# Patient Record
Sex: Male | Born: 1955 | Race: Black or African American | Hispanic: No | Marital: Married | Smoking: Never smoker
Health system: Southern US, Community
[De-identification: ages and names within clinical notes are randomized; demographics above are authoritative.]

## PROBLEM LIST (undated history)

## (undated) DIAGNOSIS — G4733 Obstructive sleep apnea (adult) (pediatric): Secondary | ICD-10-CM

## (undated) DIAGNOSIS — E119 Type 2 diabetes mellitus without complications: Secondary | ICD-10-CM

## (undated) DIAGNOSIS — Z973 Presence of spectacles and contact lenses: Secondary | ICD-10-CM

## (undated) DIAGNOSIS — E7800 Pure hypercholesterolemia, unspecified: Secondary | ICD-10-CM

## (undated) DIAGNOSIS — G4730 Sleep apnea, unspecified: Secondary | ICD-10-CM

## (undated) DIAGNOSIS — E785 Hyperlipidemia, unspecified: Secondary | ICD-10-CM

## (undated) DIAGNOSIS — I10 Essential (primary) hypertension: Secondary | ICD-10-CM

## (undated) DIAGNOSIS — M1990 Unspecified osteoarthritis, unspecified site: Secondary | ICD-10-CM

## (undated) DIAGNOSIS — N529 Male erectile dysfunction, unspecified: Secondary | ICD-10-CM

## (undated) DIAGNOSIS — H409 Unspecified glaucoma: Secondary | ICD-10-CM

## (undated) HISTORY — DX: Essential (primary) hypertension: I10

## (undated) HISTORY — DX: Pure hypercholesterolemia, unspecified: E78.00

## (undated) HISTORY — DX: Male erectile dysfunction, unspecified: N52.9

## (undated) HISTORY — DX: Unspecified glaucoma: H40.9

## (undated) HISTORY — DX: Type 2 diabetes mellitus without complications: E11.9

## (undated) HISTORY — PX: INGUINAL HERNIA REPAIR: SHX194

## (undated) HISTORY — DX: Sleep apnea, unspecified: G47.30

---

## 2000-01-06 DIAGNOSIS — H409 Unspecified glaucoma: Secondary | ICD-10-CM

## 2000-01-06 HISTORY — DX: Unspecified glaucoma: H40.9

## 2000-12-27 ENCOUNTER — Emergency Department (HOSPITAL_COMMUNITY): Admission: EM | Admit: 2000-12-27 | Discharge: 2000-12-27 | Payer: Self-pay | Admitting: Emergency Medicine

## 2003-02-16 ENCOUNTER — Encounter: Admission: RE | Admit: 2003-02-16 | Discharge: 2003-02-16 | Payer: Self-pay | Admitting: *Deleted

## 2003-02-22 ENCOUNTER — Ambulatory Visit (HOSPITAL_COMMUNITY): Admission: RE | Admit: 2003-02-22 | Discharge: 2003-02-22 | Payer: Self-pay | Admitting: *Deleted

## 2005-03-11 ENCOUNTER — Encounter: Admission: RE | Admit: 2005-03-11 | Discharge: 2005-03-11 | Payer: Self-pay | Admitting: Family Medicine

## 2013-06-28 DIAGNOSIS — N529 Male erectile dysfunction, unspecified: Secondary | ICD-10-CM | POA: Insufficient documentation

## 2013-06-28 DIAGNOSIS — E7800 Pure hypercholesterolemia, unspecified: Secondary | ICD-10-CM | POA: Insufficient documentation

## 2013-06-29 ENCOUNTER — Encounter: Payer: BC Managed Care – PPO | Attending: Family Medicine

## 2013-07-06 ENCOUNTER — Ambulatory Visit: Payer: Self-pay

## 2013-07-13 ENCOUNTER — Ambulatory Visit: Payer: Self-pay

## 2013-07-20 ENCOUNTER — Encounter: Payer: BC Managed Care – PPO | Attending: Family Medicine

## 2013-07-20 VITALS — Ht 73.0 in | Wt 269.0 lb

## 2013-07-20 DIAGNOSIS — E119 Type 2 diabetes mellitus without complications: Secondary | ICD-10-CM | POA: Diagnosis not present

## 2013-07-20 NOTE — Progress Notes (Signed)
Patient was seen on 07/20/2013 for the first of a series of three diabetes self-management courses at the [facility].  Current HbA1c: 8.5%  The following learning objectives were met by the patient during this class:  Describe diabetes  State some common risk factors for diabetes  Defines the role of glucose and insulin  Identifies type of diabetes and pathophysiology  Describe the relationship between diabetes and cardiovascular risk  State the members of the Healthcare Team  States the rationale for glucose monitoring  State when to test glucose  State their individual Target Range  State the importance of logging glucose readings  Describe how to interpret glucose readings  Identifies A1C target  Explain the correlation between A1c and eAG values  State symptoms and treatment of high blood glucose  State symptoms and treatment of low blood glucose  Explain proper technique for glucose testing  Identifies proper sharps disposal  Handouts given during class include:  Living Well with Diabetes book  Carb Counting and Meal Planning book  Meal Plan Card  Carbohydrate guide  Meal planning worksheet  Low Sodium Flavoring Tips  The diabetes portion plate  Y1O to eAG Conversion Chart  Diabetes Medications  Diabetes Recommended Care Schedule  Support Group  Diabetes Success Plan  Core Class Satisfaction Survey  Follow-Up Plan:  Attend core 2

## 2013-07-27 ENCOUNTER — Ambulatory Visit: Payer: Self-pay

## 2013-08-03 ENCOUNTER — Ambulatory Visit: Payer: Self-pay

## 2015-07-18 DIAGNOSIS — H5201 Hypermetropia, right eye: Secondary | ICD-10-CM | POA: Diagnosis not present

## 2015-07-18 DIAGNOSIS — H5212 Myopia, left eye: Secondary | ICD-10-CM | POA: Diagnosis not present

## 2015-07-18 DIAGNOSIS — H401131 Primary open-angle glaucoma, bilateral, mild stage: Secondary | ICD-10-CM | POA: Diagnosis not present

## 2015-09-04 DIAGNOSIS — E7800 Pure hypercholesterolemia, unspecified: Secondary | ICD-10-CM | POA: Diagnosis not present

## 2015-09-04 DIAGNOSIS — I10 Essential (primary) hypertension: Secondary | ICD-10-CM | POA: Diagnosis not present

## 2015-09-04 DIAGNOSIS — E119 Type 2 diabetes mellitus without complications: Secondary | ICD-10-CM | POA: Diagnosis not present

## 2015-09-04 DIAGNOSIS — N529 Male erectile dysfunction, unspecified: Secondary | ICD-10-CM | POA: Diagnosis not present

## 2015-09-04 DIAGNOSIS — L84 Corns and callosities: Secondary | ICD-10-CM | POA: Diagnosis not present

## 2016-02-06 DIAGNOSIS — H401131 Primary open-angle glaucoma, bilateral, mild stage: Secondary | ICD-10-CM | POA: Diagnosis not present

## 2016-02-13 DIAGNOSIS — H40033 Anatomical narrow angle, bilateral: Secondary | ICD-10-CM | POA: Diagnosis not present

## 2016-08-18 DIAGNOSIS — H401131 Primary open-angle glaucoma, bilateral, mild stage: Secondary | ICD-10-CM | POA: Diagnosis not present

## 2016-08-18 DIAGNOSIS — H40032 Anatomical narrow angle, left eye: Secondary | ICD-10-CM | POA: Diagnosis not present

## 2016-09-14 DIAGNOSIS — E7800 Pure hypercholesterolemia, unspecified: Secondary | ICD-10-CM | POA: Diagnosis not present

## 2016-09-14 DIAGNOSIS — M2042 Other hammer toe(s) (acquired), left foot: Secondary | ICD-10-CM | POA: Diagnosis not present

## 2016-09-14 DIAGNOSIS — I10 Essential (primary) hypertension: Secondary | ICD-10-CM | POA: Diagnosis not present

## 2016-09-14 DIAGNOSIS — E119 Type 2 diabetes mellitus without complications: Secondary | ICD-10-CM | POA: Diagnosis not present

## 2016-10-21 DIAGNOSIS — H40033 Anatomical narrow angle, bilateral: Secondary | ICD-10-CM | POA: Diagnosis not present

## 2016-10-21 DIAGNOSIS — H40032 Anatomical narrow angle, left eye: Secondary | ICD-10-CM | POA: Diagnosis not present

## 2016-12-21 DIAGNOSIS — H401131 Primary open-angle glaucoma, bilateral, mild stage: Secondary | ICD-10-CM | POA: Diagnosis not present

## 2017-01-05 HISTORY — PX: COLONOSCOPY WITH PROPOFOL: SHX5780

## 2017-01-05 HISTORY — PX: COLONOSCOPY: SHX174

## 2017-01-22 DIAGNOSIS — G4733 Obstructive sleep apnea (adult) (pediatric): Secondary | ICD-10-CM | POA: Diagnosis not present

## 2017-03-30 DIAGNOSIS — H40033 Anatomical narrow angle, bilateral: Secondary | ICD-10-CM | POA: Diagnosis not present

## 2017-04-21 DIAGNOSIS — H40033 Anatomical narrow angle, bilateral: Secondary | ICD-10-CM | POA: Diagnosis not present

## 2017-04-29 DIAGNOSIS — E669 Obesity, unspecified: Secondary | ICD-10-CM | POA: Diagnosis not present

## 2017-04-29 DIAGNOSIS — Z125 Encounter for screening for malignant neoplasm of prostate: Secondary | ICD-10-CM | POA: Diagnosis not present

## 2017-04-29 DIAGNOSIS — I10 Essential (primary) hypertension: Secondary | ICD-10-CM | POA: Diagnosis not present

## 2017-04-29 DIAGNOSIS — Z1159 Encounter for screening for other viral diseases: Secondary | ICD-10-CM | POA: Diagnosis not present

## 2017-04-29 DIAGNOSIS — E7800 Pure hypercholesterolemia, unspecified: Secondary | ICD-10-CM | POA: Diagnosis not present

## 2017-04-29 DIAGNOSIS — E119 Type 2 diabetes mellitus without complications: Secondary | ICD-10-CM | POA: Diagnosis not present

## 2017-05-05 DIAGNOSIS — H2000 Unspecified acute and subacute iridocyclitis: Secondary | ICD-10-CM | POA: Diagnosis not present

## 2017-06-29 DIAGNOSIS — K5730 Diverticulosis of large intestine without perforation or abscess without bleeding: Secondary | ICD-10-CM | POA: Diagnosis not present

## 2017-06-29 DIAGNOSIS — Z86010 Personal history of colonic polyps: Secondary | ICD-10-CM | POA: Diagnosis not present

## 2017-08-20 DIAGNOSIS — E119 Type 2 diabetes mellitus without complications: Secondary | ICD-10-CM | POA: Diagnosis not present

## 2017-09-03 DIAGNOSIS — R351 Nocturia: Secondary | ICD-10-CM | POA: Diagnosis not present

## 2017-10-08 DIAGNOSIS — H401131 Primary open-angle glaucoma, bilateral, mild stage: Secondary | ICD-10-CM | POA: Diagnosis not present

## 2017-10-08 DIAGNOSIS — H40033 Anatomical narrow angle, bilateral: Secondary | ICD-10-CM | POA: Diagnosis not present

## 2017-10-21 DIAGNOSIS — M545 Low back pain: Secondary | ICD-10-CM | POA: Diagnosis not present

## 2017-11-25 DIAGNOSIS — E119 Type 2 diabetes mellitus without complications: Secondary | ICD-10-CM | POA: Diagnosis not present

## 2017-11-25 DIAGNOSIS — E669 Obesity, unspecified: Secondary | ICD-10-CM | POA: Diagnosis not present

## 2017-11-25 DIAGNOSIS — I10 Essential (primary) hypertension: Secondary | ICD-10-CM | POA: Diagnosis not present

## 2017-11-25 DIAGNOSIS — E7800 Pure hypercholesterolemia, unspecified: Secondary | ICD-10-CM | POA: Diagnosis not present

## 2018-05-02 DIAGNOSIS — H2513 Age-related nuclear cataract, bilateral: Secondary | ICD-10-CM | POA: Diagnosis not present

## 2018-05-02 DIAGNOSIS — H401131 Primary open-angle glaucoma, bilateral, mild stage: Secondary | ICD-10-CM | POA: Diagnosis not present

## 2018-05-02 DIAGNOSIS — H40033 Anatomical narrow angle, bilateral: Secondary | ICD-10-CM | POA: Diagnosis not present

## 2018-05-26 DIAGNOSIS — I10 Essential (primary) hypertension: Secondary | ICD-10-CM | POA: Diagnosis not present

## 2018-05-26 DIAGNOSIS — E7800 Pure hypercholesterolemia, unspecified: Secondary | ICD-10-CM | POA: Diagnosis not present

## 2018-05-26 DIAGNOSIS — E669 Obesity, unspecified: Secondary | ICD-10-CM | POA: Diagnosis not present

## 2018-05-26 DIAGNOSIS — E119 Type 2 diabetes mellitus without complications: Secondary | ICD-10-CM | POA: Diagnosis not present

## 2018-06-06 DIAGNOSIS — E119 Type 2 diabetes mellitus without complications: Secondary | ICD-10-CM | POA: Diagnosis not present

## 2018-06-06 DIAGNOSIS — Z125 Encounter for screening for malignant neoplasm of prostate: Secondary | ICD-10-CM | POA: Diagnosis not present

## 2018-11-01 DIAGNOSIS — H401131 Primary open-angle glaucoma, bilateral, mild stage: Secondary | ICD-10-CM | POA: Diagnosis not present

## 2018-11-28 DIAGNOSIS — I10 Essential (primary) hypertension: Secondary | ICD-10-CM | POA: Diagnosis not present

## 2018-11-28 DIAGNOSIS — M150 Primary generalized (osteo)arthritis: Secondary | ICD-10-CM | POA: Diagnosis not present

## 2018-11-28 DIAGNOSIS — E7800 Pure hypercholesterolemia, unspecified: Secondary | ICD-10-CM | POA: Diagnosis not present

## 2018-11-28 DIAGNOSIS — E119 Type 2 diabetes mellitus without complications: Secondary | ICD-10-CM | POA: Diagnosis not present

## 2018-12-07 DIAGNOSIS — E7800 Pure hypercholesterolemia, unspecified: Secondary | ICD-10-CM | POA: Diagnosis not present

## 2018-12-07 DIAGNOSIS — E119 Type 2 diabetes mellitus without complications: Secondary | ICD-10-CM | POA: Diagnosis not present

## 2018-12-07 DIAGNOSIS — Z23 Encounter for immunization: Secondary | ICD-10-CM | POA: Diagnosis not present

## 2018-12-13 DIAGNOSIS — H401131 Primary open-angle glaucoma, bilateral, mild stage: Secondary | ICD-10-CM | POA: Diagnosis not present

## 2020-01-15 ENCOUNTER — Other Ambulatory Visit: Payer: 59

## 2020-01-15 DIAGNOSIS — Z20822 Contact with and (suspected) exposure to covid-19: Secondary | ICD-10-CM

## 2020-01-18 LAB — SARS-COV-2, NAA 2 DAY TAT

## 2020-01-18 LAB — NOVEL CORONAVIRUS, NAA: SARS-CoV-2, NAA: DETECTED — AB

## 2020-01-19 ENCOUNTER — Telehealth: Payer: Self-pay | Admitting: General Practice

## 2020-01-19 NOTE — Telephone Encounter (Signed)
Called patient back no answer. Left message to return call

## 2020-01-19 NOTE — Telephone Encounter (Signed)
Pt called in returning the nurse call for covid results   Please call pt back at number on file.

## 2020-01-19 NOTE — Telephone Encounter (Signed)
Patient is calling to receive his COVID test results. Please advise CB- [phone]

## 2021-03-05 DIAGNOSIS — C61 Malignant neoplasm of prostate: Secondary | ICD-10-CM

## 2021-03-05 HISTORY — DX: Malignant neoplasm of prostate: C61

## 2021-03-24 ENCOUNTER — Other Ambulatory Visit (HOSPITAL_COMMUNITY): Payer: Self-pay | Admitting: Urology

## 2021-03-24 DIAGNOSIS — C61 Malignant neoplasm of prostate: Secondary | ICD-10-CM

## 2021-03-25 ENCOUNTER — Telehealth: Payer: Self-pay | Admitting: Radiation Oncology

## 2021-03-25 NOTE — Telephone Encounter (Signed)
LVM to schedule consult with Dr. Manning.  ?

## 2021-03-25 NOTE — Telephone Encounter (Signed)
Returning call and LVM to schedule with Dr. Tammi Klippel  ?

## 2021-04-02 NOTE — Progress Notes (Incomplete)
GU Location of Tumor / Histology: Prostate Ca ? ?If Prostate Cancer, Gleason Score is (4 + 3) and PSA is (5.52 as of 01/2021) ? ?Biopsies: ?Dr. Cain Sieve ? ? ? ? ? ?Past/Anticipated interventions by urology, if any:  ? ? ? ? ?Past/Anticipated interventions by medical oncology, if any: NA ? ?Weight changes, if any:  ? ?IPSS: ?SHIM: ? ?Bowel/Bladder complaints, if any:   ? ?Nausea/Vomiting, if any:  ? ?Pain issues, if any:   ? ?SAFETY ISSUES: ?Prior radiation?  ?Pacemaker/ICD?  ?Possible current pregnancy? Male ?Is the patient on methotrexate?  ? ?Current Complaints / other details:  Need more information on treatment options. ?

## 2021-04-07 ENCOUNTER — Ambulatory Visit (HOSPITAL_COMMUNITY): Payer: 59

## 2021-04-07 ENCOUNTER — Encounter (HOSPITAL_COMMUNITY)
Admission: RE | Admit: 2021-04-07 | Discharge: 2021-04-07 | Disposition: A | Discharge status: Home / Self Care | Payer: 59 | Source: Ambulatory Visit | Attending: Urology | Admitting: Urology

## 2021-04-07 DIAGNOSIS — C61 Malignant neoplasm of prostate: Secondary | ICD-10-CM | POA: Insufficient documentation

## 2021-04-07 MED ORDER — PIFLIFOLASTAT F 18 (PYLARIFY) INJECTION
9.0000 | Freq: Once | INTRAVENOUS | Status: AC
  Administered 2021-04-07: 9.69 via INTRAVENOUS

## 2021-04-09 ENCOUNTER — Ambulatory Visit: Payer: 59

## 2021-04-09 ENCOUNTER — Ambulatory Visit: Payer: 59 | Admitting: Radiation Oncology

## 2021-04-09 DIAGNOSIS — G4733 Obstructive sleep apnea (adult) (pediatric): Secondary | ICD-10-CM | POA: Insufficient documentation

## 2021-04-09 DIAGNOSIS — D179 Benign lipomatous neoplasm, unspecified: Secondary | ICD-10-CM | POA: Insufficient documentation

## 2021-04-09 DIAGNOSIS — M2042 Other hammer toe(s) (acquired), left foot: Secondary | ICD-10-CM | POA: Insufficient documentation

## 2021-04-09 DIAGNOSIS — E669 Obesity, unspecified: Secondary | ICD-10-CM | POA: Insufficient documentation

## 2021-04-09 DIAGNOSIS — E1165 Type 2 diabetes mellitus with hyperglycemia: Secondary | ICD-10-CM | POA: Insufficient documentation

## 2021-04-09 DIAGNOSIS — K429 Umbilical hernia without obstruction or gangrene: Secondary | ICD-10-CM | POA: Insufficient documentation

## 2021-04-09 DIAGNOSIS — I10 Essential (primary) hypertension: Secondary | ICD-10-CM | POA: Insufficient documentation

## 2021-04-09 DIAGNOSIS — C61 Malignant neoplasm of prostate: Secondary | ICD-10-CM | POA: Insufficient documentation

## 2021-04-09 DIAGNOSIS — E119 Type 2 diabetes mellitus without complications: Secondary | ICD-10-CM | POA: Insufficient documentation

## 2021-04-09 DIAGNOSIS — M150 Primary generalized (osteo)arthritis: Secondary | ICD-10-CM | POA: Insufficient documentation

## 2021-04-09 NOTE — Progress Notes (Incomplete)
?Radiation Oncology         [phone] ?________________________________ ? ?Initial Outpatient Consultation ? ?Name: Francisco Sanders MRN: [id number]  ?Date: 04/09/2021  DOB: 1955-08-08 ? ?CC:[facility] [employer] [facility]  Vira Agar, MD  ? ?REFERRING PHYSICIAN: Vira Agar, MD ? ?DIAGNOSIS: 66 y.o. gentleman with Stage T1c adenocarcinoma of the prostate with Gleason score of 4+3, and PSA of 5.52. ? ?  ICD-10-CM   ?1. Malignant neoplasm of prostate ([place])  C61   ?  ? ? ?HISTORY OF PRESENT ILLNESS: Francisco Sanders is a 66 y.o. male with a diagnosis of prostate cancer. He was noted to have an elevated PSA of 5.52 on routine screening labs drawn 01/14/2021 with his primary care physician, Dr. Alroy Dust.  His previous PSA was 4.31 in December 2021, 3.16 in June 2020 and 2.66 in April 2019.  Accordingly, he was referred for evaluation in urology by Dr. Cain Sieve on 02/14/2021.  The patient proceeded to transrectal ultrasound with 12 biopsies of the prostate on 03/14/2021.  The prostate volume measured 31.4 cc.  Out of 12 core biopsies, 7 were positive.  The maximum Gleason score was 4+3, and this was seen in the right base lateral and right mid lateral.  Additionally, Gleason 3+4 was seen in the right base, right mid, right apex and right apex lateral and Gleason 3+3 was seen in the left apex lateral. ? ?A PSMA PET scan was performed on 04/08/2021 for disease staging and was without any definite evidence of metastatic disease.  He met with Dr. Alinda Money earlier today to discuss surgical options and digital rectal exam performed at that time was without any nodularity or concerning findings. ? ?The patient reviewed the biopsy and imaging results with his urologist and he has kindly been referred today for discussion of potential radiation treatment options. ? ? ?PREVIOUS RADIATION THERAPY: No ? ?PAST MEDICAL HISTORY:  ?Past Medical History:  ?Diagnosis Date  ? Diabetes mellitus without complication ([place])   ? Erectile  dysfunction   ? Glaucoma 2002  ? Dr. Claudean Kinds  ? Hypercholesterolemia   ? Hypertension   ? Sleep apnea   ?   ? ?PAST SURGICAL HISTORY:No past surgical history on file. ? ?FAMILY HISTORY:  ?Family History  ?Problem Relation Age of Onset  ? Breast cancer Mother   ? Heart disease Father   ? Hypertension Father   ? Hypertension Sister   ? Hypertension Brother   ? Hypertension Sister   ? ? ?SOCIAL HISTORY:  ?Social History  ? ?Socioeconomic History  ? Marital status: Married  ?  Spouse name: Not on file  ? Number of children: Not on file  ? Years of education: Not on file  ? Highest education level: Not on file  ?Occupational History  ? Occupation: truck Geophysicist/field seismologist  ?Tobacco Use  ? Smoking status: Never  ? Smokeless tobacco: Current  ?  Types: Chew  ? Tobacco comments:  ?  1/2 can per week  ?Substance and Sexual Activity  ? Alcohol use: Yes  ? Drug use: No  ? Sexual activity: Not on file  ?Other Topics Concern  ? Not on file  ?Social History Narrative  ? Not on file  ? ?Social Determinants of Health  ? ?Financial Resource Strain: Not on file  ?Food Insecurity: Not on file  ?Transportation Needs: Not on file  ?Physical Activity: Not on file  ?Stress: Not on file  ?Social Connections: Not on file  ?Intimate Partner Violence: Not on  file  ? ? ?ALLERGIES: Patient has no known allergies. ? ?MEDICATIONS:  ?Current Outpatient Medications  ?Medication Sig Dispense Refill  ? metFORMIN (GLUCOPHAGE) 500 MG tablet 1 tablet    ? aspirin 81 MG chewable tablet 1 tablet    ? atorvastatin (LIPITOR) 20 MG tablet Take 20 mg by mouth daily.    ? bimatoprost (LUMIGAN) 0.03 % ophthalmic solution 1 drop at bedtime. Place one drop into affected eye daily, every evening.    ? brimonidine-timolol (COMBIGAN) 0.2-0.5 % ophthalmic solution 1 drop into affected eye    ? Diltiazem HCl Coated Beads (DILTIAZEM HCL ER COATED BEADS PO) Take 1 capsule by mouth daily.    ? dorzolamide-timolol (COSOPT) 22.3-6.8 MG/ML ophthalmic solution 1 drop into affected eye     ? fluorometholone (FML) 0.1 % ophthalmic suspension Place 1 drop into the right eye daily.    ? LUMIGAN 0.01 % SOLN 1 drop at bedtime.    ? sildenafil (REVATIO) 20 MG tablet TAKE 2-5 TABLETS AS NEEDED FOR SEXUAL FUNCTION ORALLY    ? sildenafil (VIAGRA) 100 MG tablet Take 100 mg by mouth daily as needed for erectile dysfunction.    ? telmisartan-hydrochlorothiazide (MICARDIS HCT) 80-25 MG per tablet Take 1 tablet by mouth daily.    ? ?No current facility-administered medications for this visit.  ? ? ?REVIEW OF SYSTEMS:  On review of systems, the patient reports that he is doing well overall. He denies any chest pain, shortness of breath, cough, fevers, chills, night sweats, unintended weight changes. He denies any bowel disturbances, and denies abdominal pain, nausea or vomiting. He denies any new musculoskeletal or joint aches or pains. His IPSS was ***, indicating *** urinary symptoms. His SHIM was ***, indicating he {does not have/has mild/moderate/severe} erectile dysfunction. A complete review of systems is obtained and is otherwise negative. ? ?  ?PHYSICAL EXAM:  ?Wt Readings from Last 3 Encounters:  ?07/20/13 269 lb (122 kg)  ?06/09/13 227 lb (103 kg)  ? ?Temp Readings from Last 3 Encounters:  ?No data found for Temp  ? ?BP Readings from Last 3 Encounters:  ?06/09/13 122/80  ? ?Pulse Readings from Last 3 Encounters:  ?06/09/13 76  ? ? /10 ? ?In general this is a well appearing African-American male in no acute distress. He's alert and oriented x4 and appropriate throughout the examination. Cardiopulmonary assessment is negative for acute distress, and he exhibits normal effort.   ? ? ?KPS = 100 ? ?100 - Normal; no complaints; no evidence of disease. ?90   - Able to carry on normal activity; minor signs or symptoms of disease. ?80   - Normal activity with effort; some signs or symptoms of disease. ?15   - Cares for self; unable to carry on normal activity or to do active work. ?60   - Requires occasional  assistance, but is able to care for most of his personal needs. ?50   - Requires considerable assistance and frequent medical care. ?42   - Disabled; requires special care and assistance. ?30   - Severely disabled; hospital admission is indicated although death not imminent. ?20   - Very sick; hospital admission necessary; active supportive treatment necessary. ?10   - Moribund; fatal processes progressing rapidly. ?0     - Dead ? ?Karnofsky DA, Abelmann WH, Craver LS and Burchenal [facility] [phone]) The use of the nitrogen mustards in the palliative treatment of carcinoma: with particular reference to bronchogenic carcinoma Cancer 1 634-56 ? ?LABORATORY DATA:  ?No  results found for: WBC, HGB, HCT, MCV, PLT ?No results found for: NA, K, CL, CO2 ?No results found for: ALT, AST, GGT, ALKPHOS, BILITOT ?  ?RADIOGRAPHY: NM PET (PSMA) SKULL TO MID THIGH ? ?Result Date: 04/08/2021 ?CLINICAL DATA:  PSA of 5.5. Prostate cancer diagnosed in February. No surgery. EXAM: NUCLEAR MEDICINE PET SKULL BASE TO THIGH TECHNIQUE: 9.7 mCi F18 Piflufolastat (Pylarify) was injected intravenously. Full-ring PET imaging was performed from the skull base to thigh after the radiotracer. CT data was obtained and used for attenuation correction and anatomic localization. COMPARISON:  None. FINDINGS: NECK Symmetric tiny soft tissue densities with low-level hypermetabolism at the thoracic inlet, including on the left at a S.U.V. max of 3.8 on 66/4 are most consistent with normal variation activity within cervical ganglia. No cervical nodal hypermetabolism. Incidental CT finding: Bilateral carotid atherosclerosis. No cervical adenopathy. CHEST A left supraclavicular node measures 8 mm and a S.U.V. max of 2.7 on 75/4. No pulmonary parenchymal tracer uptake. Incidental CT finding: Mild cardiomegaly.  Aortic atherosclerosis. ABDOMEN/PELVIS Prostate: Multifocal prostatic uptake. Example within the central right base at a S.U.V. max of 5.5. Within the left apical  anterior and less so posterior gland at a S.U.V. max of 6.7. Lymph nodes: No abnormal radiotracer accumulation within pelvic or abdominal nodes. Uptake within a 7 mm soft tissue density along the celiac axis

## 2021-04-22 ENCOUNTER — Ambulatory Visit
Admission: RE | Admit: 2021-04-22 | Discharge: 2021-04-22 | Disposition: A | Discharge status: Home / Self Care | Payer: 59 | Source: Ambulatory Visit | Attending: Radiation Oncology | Admitting: Radiation Oncology

## 2021-04-22 ENCOUNTER — Other Ambulatory Visit: Payer: Self-pay

## 2021-04-22 VITALS — BP 155/83 | HR 81 | Temp 97.8°F | Resp 20 | Ht 73.0 in | Wt 263.8 lb

## 2021-04-22 DIAGNOSIS — C61 Malignant neoplasm of prostate: Secondary | ICD-10-CM | POA: Insufficient documentation

## 2021-04-22 DIAGNOSIS — E7800 Pure hypercholesterolemia, unspecified: Secondary | ICD-10-CM | POA: Insufficient documentation

## 2021-04-22 DIAGNOSIS — K5730 Diverticulosis of large intestine without perforation or abscess without bleeding: Secondary | ICD-10-CM | POA: Diagnosis not present

## 2021-04-22 DIAGNOSIS — G4730 Sleep apnea, unspecified: Secondary | ICD-10-CM | POA: Insufficient documentation

## 2021-04-22 DIAGNOSIS — I700 Atherosclerosis of aorta: Secondary | ICD-10-CM | POA: Diagnosis not present

## 2021-04-22 DIAGNOSIS — I6523 Occlusion and stenosis of bilateral carotid arteries: Secondary | ICD-10-CM | POA: Insufficient documentation

## 2021-04-22 DIAGNOSIS — E119 Type 2 diabetes mellitus without complications: Secondary | ICD-10-CM | POA: Insufficient documentation

## 2021-04-22 DIAGNOSIS — Z79899 Other long term (current) drug therapy: Secondary | ICD-10-CM | POA: Diagnosis not present

## 2021-04-22 DIAGNOSIS — F17220 Nicotine dependence, chewing tobacco, uncomplicated: Secondary | ICD-10-CM | POA: Diagnosis not present

## 2021-04-22 DIAGNOSIS — I10 Essential (primary) hypertension: Secondary | ICD-10-CM | POA: Diagnosis not present

## 2021-04-22 DIAGNOSIS — Z803 Family history of malignant neoplasm of breast: Secondary | ICD-10-CM | POA: Diagnosis not present

## 2021-04-22 NOTE — H&P (View-Only) (Signed)
?Radiation Oncology         [phone] ?________________________________ ? ?Initial Outpatient Consultation ? ?Name: Francisco Sanders MRN: [id number]  ?Date: 04/22/2021  DOB: [birth year] ? ?CC:[facility]  Vira Agar, MD  ? ?REFERRING PHYSICIAN: Vira Agar, MD ? ?DIAGNOSIS: 66 y.o. gentleman with Stage T1c adenocarcinoma of the prostate with Gleason score of 4+3, and PSA of 5.52. ? ?  ICD-10-CM   ?1. Malignant neoplasm of prostate ([place])  C61   ?  ? ? ?HISTORY OF PRESENT ILLNESS: Francisco Sanders is a 66 y.o. male with a diagnosis of prostate cancer. He has a history of elevated PSA, gradually rising since 2019, most recently reaching 5.52 on screening labs with his primary care physician, Dr. Alroy Dust.  Accordingly, he was referred for evaluation in urology by Dr. Cain Sieve on 02/14/21. The patient proceeded to transrectal ultrasound with 12 biopsies of the prostate on 03/14/21.  The prostate volume measured 34.1 cc.  Out of 12 core biopsies, 7 were positive, including all right-sided cores.  The maximum Gleason score was 4+3, and this was seen in the right mid lateral and right base lateral. Additionally, Gleason 3+4 was seen in the right apex lateral, right apex, right mid, and right base, and a small focus of Gleason 3+3 in the left apex lateral. ? ?A PSMA PET scan was performed on 04/07/21 for disease staging and showed uptake within prostate and a left supraclavicular subcentimeter node with low-level tracer uptake, favored reactive. He met with Dr. Alinda Money on 04/08/21 to discuss his surgical treatment options and has kindly been referred today for discussion of potential radiation treatment options. ? ? ?PREVIOUS RADIATION THERAPY: No ? ?PAST MEDICAL HISTORY:  ?Past Medical History:  ?Diagnosis Date  ? Diabetes mellitus without complication ([place])   ? Erectile dysfunction   ? Glaucoma 2002  ? Dr. Claudean Kinds  ? Hypercholesterolemia   ? Hypertension   ? Sleep apnea   ?   ? ?PAST SURGICAL  HISTORY:No past surgical history on file. ? ?FAMILY HISTORY:  ?Family History  ?Problem Relation Age of Onset  ? Breast cancer Mother   ? Heart disease Father   ? Hypertension Father   ? Hypertension Sister   ? Hypertension Brother   ? Hypertension Sister   ? ? ?SOCIAL HISTORY:  ?Social History  ? ?Socioeconomic History  ? Marital status: Married  ?  Spouse name: Not on file  ? Number of children: Not on file  ? Years of education: Not on file  ? Highest education level: Not on file  ?Occupational History  ? Occupation: truck Geophysicist/field seismologist  ?Tobacco Use  ? Smoking status: Never  ? Smokeless tobacco: Current  ?  Types: Chew  ? Tobacco comments:  ?  1/2 can per week  ?Substance and Sexual Activity  ? Alcohol use: Yes  ? Drug use: No  ? Sexual activity: Not on file  ?Other Topics Concern  ? Not on file  ?Social History Narrative  ? Not on file  ? ?Social Determinants of Health  ? ?Financial Resource Strain: Not on file  ?Food Insecurity: Not on file  ?Transportation Needs: Not on file  ?Physical Activity: Not on file  ?Stress: Not on file  ?Social Connections: Not on file  ?Intimate Partner Violence: Not on file  ? ? ?ALLERGIES: Patient has no known allergies. ? ?MEDICATIONS:  ?Current Outpatient Medications  ?Medication Sig Dispense Refill  ? aspirin 81 MG chewable tablet 1 tablet    ?  atorvastatin (LIPITOR) 20 MG tablet Take 20 mg by mouth daily.    ? bimatoprost (LUMIGAN) 0.03 % ophthalmic solution 1 drop at bedtime. Place one drop into affected eye daily, every evening.    ? brimonidine-timolol (COMBIGAN) 0.2-0.5 % ophthalmic solution 1 drop into affected eye    ? Diltiazem HCl Coated Beads (DILTIAZEM HCL ER COATED BEADS PO) Take 1 capsule by mouth daily.    ? dorzolamide-timolol (COSOPT) 22.3-6.8 MG/ML ophthalmic solution 1 drop into affected eye    ? fluorometholone (FML) 0.1 % ophthalmic suspension Place 1 drop into the right eye daily.    ? LUMIGAN 0.01 % SOLN 1 drop at bedtime.    ? telmisartan-hydrochlorothiazide  (MICARDIS HCT) 80-25 MG per tablet Take 1 tablet by mouth daily.    ? ?No current facility-administered medications for this encounter.  ? ? ?REVIEW OF SYSTEMS:  On review of systems, the patient reports that he is doing well overall. He denies any chest pain, shortness of breath, cough, fevers, chills, night sweats, unintended weight changes. He denies any bowel disturbances, and denies abdominal pain, nausea or vomiting. He denies any new musculoskeletal or joint aches or pains. His IPSS was 5, indicating mild urinary symptoms. His SHIM was 19, indicating he has mild erectile dysfunction. A complete review of systems is obtained and is otherwise negative. ? ?  ?PHYSICAL EXAM:  ?Wt Readings from Last 3 Encounters:  ?04/22/21 263 lb 12.8 oz (119.7 kg)  ?07/20/13 269 lb (122 kg)  ?06/09/13 227 lb (103 kg)  ? ?Temp Readings from Last 3 Encounters:  ?04/22/21 97.8 ?F (36.6 ?C)  ? ?BP Readings from Last 3 Encounters:  ?04/22/21 (!) 155/83  ?06/09/13 122/80  ? ?Pulse Readings from Last 3 Encounters:  ?04/22/21 81  ?06/09/13 76  ? ?Pain Assessment ?Pain Score: 0-No pain/10 ? ?In general this is a well appearing African-American male in no acute distress. He's alert and oriented x4 and appropriate throughout the examination. Cardiopulmonary assessment is negative for acute distress, and he exhibits normal effort.   ? ? ?KPS = 100 ? ?100 - Normal; no complaints; no evidence of disease. ?90   - Able to carry on normal activity; minor signs or symptoms of disease. ?80   - Normal activity with effort; some signs or symptoms of disease. ?63   - Cares for self; unable to carry on normal activity or to do active work. ?60   - Requires occasional assistance, but is able to care for most of his personal needs. ?50   - Requires considerable assistance and frequent medical care. ?12   - Disabled; requires special care and assistance. ?30   - Severely disabled; hospital admission is indicated although death not imminent. ?20   - Very  sick; hospital admission necessary; active supportive treatment necessary. ?10   - Moribund; fatal processes progressing rapidly. ?0     - Dead ? ?Karnofsky DA, Abelmann WH, Craver LS and Burchenal [facility] [phone]) The use of the nitrogen mustards in the palliative treatment of carcinoma: with particular reference to bronchogenic carcinoma Cancer 1 634-56 ? ?LABORATORY DATA:  ?No results found for: WBC, HGB, HCT, MCV, PLT ?No results found for: NA, K, CL, CO2 ?No results found for: ALT, AST, GGT, ALKPHOS, BILITOT ?  ?RADIOGRAPHY: NM PET (PSMA) SKULL TO MID THIGH ? ?Result Date: 04/08/2021 ?CLINICAL DATA:  PSA of 5.5. Prostate cancer diagnosed in February. No surgery. EXAM: NUCLEAR MEDICINE PET SKULL BASE TO THIGH TECHNIQUE: 9.7 mCi F18 Piflufolastat (Pylarify)  was injected intravenously. Full-ring PET imaging was performed from the skull base to thigh after the radiotracer. CT data was obtained and used for attenuation correction and anatomic localization. COMPARISON:  None. FINDINGS: NECK Symmetric tiny soft tissue densities with low-level hypermetabolism at the thoracic inlet, including on the left at a S.U.V. max of 3.8 on 66/4 are most consistent with normal variation activity within cervical ganglia. No cervical nodal hypermetabolism. Incidental CT finding: Bilateral carotid atherosclerosis. No cervical adenopathy. CHEST A left supraclavicular node measures 8 mm and a S.U.V. max of 2.7 on 75/4. No pulmonary parenchymal tracer uptake. Incidental CT finding: Mild cardiomegaly.  Aortic atherosclerosis. ABDOMEN/PELVIS Prostate: Multifocal prostatic uptake. Example within the central right base at a S.U.V. max of 5.5. Within the left apical anterior and less so posterior gland at a S.U.V. max of 6.7. Lymph nodes: No abnormal radiotracer accumulation within pelvic or abdominal nodes. Uptake within a 7 mm soft tissue density along the celiac axis on 143/4 at a S.U.V. max of 3.3 is typical of normal variant celiac ganglion  uptake. Liver: No evidence of liver metastasis Incidental CT finding: No abdominopelvic adenopathy. Normal adrenal glands. Normal noncontrast appearance of the liver, pancreas, gallbladder. Extensive colonic diver

## 2021-04-22 NOTE — Progress Notes (Signed)
GU Location of Tumor / Histology:  Prostate Ca ? ?If Prostate Cancer, Gleason Score is (4 + 3) and PSA is (5.52 as of 01/2021) ? ?Biopsies  ?Dr. Cain Sieve ? ? ? ? ? ?PET: 04/07/2021 ? ?FINDINGS: ?NECK ?  ?Symmetric tiny soft tissue densities with low-level hypermetabolism at the thoracic inlet, including on the left at a S.U.V. max of 3.8 on 66/4 are most consistent with normal variation activity within cervical ganglia. No cervical nodal hypermetabolism. ?  ?Incidental CT finding: Bilateral carotid atherosclerosis. No ?cervical adenopathy. ?  ?CHEST ?  ?A left supraclavicular node measures 8 mm and a S.U.V. max of 2.7 on 75/4. No pulmonary parenchymal tracer uptake. ?  ?Incidental CT finding: Mild cardiomegaly.  Aortic atherosclerosis. ?  ?ABDOMEN/PELVIS ?  ?Prostate: Multifocal prostatic uptake. Example within the central right base at a S.U.V. max of 5.5. Within the left apical anterior and less so posterior gland at a S.U.V. max of 6.7. ?  ?Lymph nodes: No abnormal radiotracer accumulation within pelvic or abdominal nodes. Uptake within a 7 mm soft tissue density along the celiac axis on 143/4 at a S.U.V. max of 3.3 is typical of normal variant celiac ganglion uptake. ?  ?Liver: No evidence of liver metastasis ?  ?Incidental CT finding: No abdominopelvic adenopathy. Normal adrenal glands. Normal noncontrast appearance of the liver, pancreas, gallbladder. Extensive colonic diverticulosis. ?  ?SKELETON ?  ?No focal  activity to suggest skeletal metastasis. ?  ?Degenerative sclerosis of both sacroiliac joints. No suspicious osseous lesion. ?  ?IMPRESSION: ?1. Uptake within the prostate is most consistent with primary lesion or lesions. ?2. A left supraclavicular subcentimeter node demonstrates low-level tracer uptake, favored to be reactive. Not in an initial nodal drainage pattern for prostate. ?3. Normal variant uptake within celiac and cervical ganglia, as detailed above. ?4. Incidental findings, including: Aortic  Atherosclerosis ?(ICD10-I70.0). ?  ?Past/Anticipated interventions by urology, if any:  ? ?Past/Anticipated interventions by medical oncology, if any: NA ? ?Weight changes, if any:  No ? ?IPSS:  5 ?SHIM:  19 ? ?Bowel/Bladder complaints, if any:  No ? ?Nausea/Vomiting, if any: No ? ?Pain issues, if any:  0/10 ? ?SAFETY ISSUES: ?Prior radiation?  No ?Pacemaker/ICD?  No ?Possible current pregnancy?  Male ?Is the patient on methotrexate? No ? ?Current Complaints / other details:  Need more information on treatment options. ?

## 2021-04-22 NOTE — Progress Notes (Signed)
Introduced myself to the patient as the prostate nurse navigator.  No barriers to care identified at this time.  He is here to discuss his radiation treatment options.  Patient recently had surgical consult with Dr. Alinda Money and is here to review additional options.  I gave him my business card and asked him to call me with questions or concerns.  Verbalized understanding. ? ?

## 2021-04-22 NOTE — Progress Notes (Signed)
?Radiation Oncology         (336) [phone] ?________________________________ ? ?Initial Outpatient Consultation ? ?Name: Francisco Sanders MRN: [id number]  ?Date: 04/22/2021  DOB: June 21, 1955 ? ?CC:[facility] [employer]  Vira Agar, MD  ? ?REFERRING PHYSICIAN: Vira Agar, MD ? ?DIAGNOSIS: 66 y.o. gentleman with Stage T1c adenocarcinoma of the prostate with Gleason score of 4+3, and PSA of 5.52. ? ?  ICD-10-CM   ?1. Malignant neoplasm of prostate ([place])  C61   ?  ? ? ?HISTORY OF PRESENT ILLNESS: Francisco Sanders is a 66 y.o. male with a diagnosis of prostate cancer. He has a history of elevated PSA, gradually rising since 2019, most recently reaching 5.52 on screening labs with his primary care physician, Dr. Alroy Dust.  Accordingly, he was referred for evaluation in urology by Dr. Cain Sieve on 02/14/21. The patient proceeded to transrectal ultrasound with 12 biopsies of the prostate on 03/14/21.  The prostate volume measured 34.1 cc.  Out of 12 core biopsies, 7 were positive, including all right-sided cores.  The maximum Gleason score was 4+3, and this was seen in the right mid lateral and right base lateral. Additionally, Gleason 3+4 was seen in the right apex lateral, right apex, right mid, and right base, and a small focus of Gleason 3+3 in the left apex lateral. ? ?A PSMA PET scan was performed on 04/07/21 for disease staging and showed uptake within prostate and a left supraclavicular subcentimeter node with low-level tracer uptake, favored reactive. He met with Dr. Alinda Money on 04/08/21 to discuss his surgical treatment options and has kindly been referred today for discussion of potential radiation treatment options. ? ? ?PREVIOUS RADIATION THERAPY: No ? ?PAST MEDICAL HISTORY:  ?Past Medical History:  ?Diagnosis Date  ? Diabetes mellitus without complication ([place])   ? Erectile dysfunction   ? Glaucoma 2002  ? Dr. Claudean Kinds  ? Hypercholesterolemia   ? Hypertension   ? Sleep apnea   ?   ? ?PAST SURGICAL  HISTORY:No past surgical history on file. ? ?FAMILY HISTORY:  ?Family History  ?Problem Relation Age of Onset  ? Breast cancer Mother   ? Heart disease Father   ? Hypertension Father   ? Hypertension Sister   ? Hypertension Brother   ? Hypertension Sister   ? ? ?SOCIAL HISTORY:  ?Social History  ? ?Socioeconomic History  ? Marital status: Married  ?  Spouse name: Not on file  ? Number of children: Not on file  ? Years of education: Not on file  ? Highest education level: Not on file  ?Occupational History  ? Occupation: truck Geophysicist/field seismologist  ?Tobacco Use  ? Smoking status: Never  ? Smokeless tobacco: Current  ?  Types: Chew  ? Tobacco comments:  ?  1/2 can per week  ?Substance and Sexual Activity  ? Alcohol use: Yes  ? Drug use: No  ? Sexual activity: Not on file  ?Other Topics Concern  ? Not on file  ?Social History Narrative  ? Not on file  ? ?Social Determinants of Health  ? ?Financial Resource Strain: Not on file  ?Food Insecurity: Not on file  ?Transportation Needs: Not on file  ?Physical Activity: Not on file  ?Stress: Not on file  ?Social Connections: Not on file  ?Intimate Partner Violence: Not on file  ? ? ?ALLERGIES: Patient has no known allergies. ? ?MEDICATIONS:  ?Current Outpatient Medications  ?Medication Sig Dispense Refill  ? aspirin 81 MG chewable tablet 1 tablet    ?  atorvastatin (LIPITOR) 20 MG tablet Take 20 mg by mouth daily.    ? bimatoprost (LUMIGAN) 0.03 % ophthalmic solution 1 drop at bedtime. Place one drop into affected eye daily, every evening.    ? brimonidine-timolol (COMBIGAN) 0.2-0.5 % ophthalmic solution 1 drop into affected eye    ? Diltiazem HCl Coated Beads (DILTIAZEM HCL ER COATED BEADS PO) Take 1 capsule by mouth daily.    ? dorzolamide-timolol (COSOPT) 22.3-6.8 MG/ML ophthalmic solution 1 drop into affected eye    ? fluorometholone (FML) 0.1 % ophthalmic suspension Place 1 drop into the right eye daily.    ? LUMIGAN 0.01 % SOLN 1 drop at bedtime.    ? telmisartan-hydrochlorothiazide  (MICARDIS HCT) 80-25 MG per tablet Take 1 tablet by mouth daily.    ? ?No current facility-administered medications for this encounter.  ? ? ?REVIEW OF SYSTEMS:  On review of systems, the patient reports that he is doing well overall. He denies any chest pain, shortness of breath, cough, fevers, chills, night sweats, unintended weight changes. He denies any bowel disturbances, and denies abdominal pain, nausea or vomiting. He denies any new musculoskeletal or joint aches or pains. His IPSS was 5, indicating mild urinary symptoms. His SHIM was 19, indicating he has mild erectile dysfunction. A complete review of systems is obtained and is otherwise negative. ? ?  ?PHYSICAL EXAM:  ?Wt Readings from Last 3 Encounters:  ?04/22/21 263 lb 12.8 oz (119.7 kg)  ?07/20/13 269 lb (122 kg)  ?06/09/13 227 lb (103 kg)  ? ?Temp Readings from Last 3 Encounters:  ?04/22/21 97.8 ?F (36.6 ?C)  ? ?BP Readings from Last 3 Encounters:  ?04/22/21 (!) 155/83  ?06/09/13 122/80  ? ?Pulse Readings from Last 3 Encounters:  ?04/22/21 81  ?06/09/13 76  ? ?Pain Assessment ?Pain Score: 0-No pain/10 ? ?In general this is a well appearing African-American male in no acute distress. He's alert and oriented x4 and appropriate throughout the examination. Cardiopulmonary assessment is negative for acute distress, and he exhibits normal effort.   ? ? ?KPS = 100 ? ?100 - Normal; no complaints; no evidence of disease. ?90   - Able to carry on normal activity; minor signs or symptoms of disease. ?80   - Normal activity with effort; some signs or symptoms of disease. ?60   - Cares for self; unable to carry on normal activity or to do active work. ?60   - Requires occasional assistance, but is able to care for most of his personal needs. ?50   - Requires considerable assistance and frequent medical care. ?65   - Disabled; requires special care and assistance. ?30   - Severely disabled; hospital admission is indicated although death not imminent. ?20   - Very  sick; hospital admission necessary; active supportive treatment necessary. ?10   - Moribund; fatal processes progressing rapidly. ?0     - Dead ? ?Karnofsky DA, Abelmann WH, Craver LS and Burchenal [facility] [phone]) The use of the nitrogen mustards in the palliative treatment of carcinoma: with particular reference to bronchogenic carcinoma Cancer 1 [phone] ? ?LABORATORY DATA:  ?No results found for: WBC, HGB, HCT, MCV, PLT ?No results found for: NA, K, CL, CO2 ?No results found for: ALT, AST, GGT, ALKPHOS, BILITOT ?  ?RADIOGRAPHY: NM PET (PSMA) SKULL TO MID THIGH ? ?Result Date: 04/08/2021 ?CLINICAL DATA:  PSA of 5.5. Prostate cancer diagnosed in February. No surgery. EXAM: NUCLEAR MEDICINE PET SKULL BASE TO THIGH TECHNIQUE: 9.7 mCi F18 Piflufolastat (Pylarify)  was injected intravenously. Full-ring PET imaging was performed from the skull base to thigh after the radiotracer. CT data was obtained and used for attenuation correction and anatomic localization. COMPARISON:  None. FINDINGS: NECK Symmetric tiny soft tissue densities with low-level hypermetabolism at the thoracic inlet, including on the left at a S.U.V. max of 3.8 on 66/4 are most consistent with normal variation activity within cervical ganglia. No cervical nodal hypermetabolism. Incidental CT finding: Bilateral carotid atherosclerosis. No cervical adenopathy. CHEST A left supraclavicular node measures 8 mm and a S.U.V. max of 2.7 on 75/4. No pulmonary parenchymal tracer uptake. Incidental CT finding: Mild cardiomegaly.  Aortic atherosclerosis. ABDOMEN/PELVIS Prostate: Multifocal prostatic uptake. Example within the central right base at a S.U.V. max of 5.5. Within the left apical anterior and less so posterior gland at a S.U.V. max of 6.7. Lymph nodes: No abnormal radiotracer accumulation within pelvic or abdominal nodes. Uptake within a 7 mm soft tissue density along the celiac axis on 143/4 at a S.U.V. max of 3.3 is typical of normal variant celiac ganglion  uptake. Liver: No evidence of liver metastasis Incidental CT finding: No abdominopelvic adenopathy. Normal adrenal glands. Normal noncontrast appearance of the liver, pancreas, gallbladder. Extensive colonic diver

## 2021-04-29 ENCOUNTER — Other Ambulatory Visit: Payer: Self-pay | Admitting: Urology

## 2021-05-15 ENCOUNTER — Encounter (HOSPITAL_BASED_OUTPATIENT_CLINIC_OR_DEPARTMENT_OTHER): Payer: Self-pay | Admitting: Urology

## 2021-05-16 ENCOUNTER — Other Ambulatory Visit: Payer: Self-pay

## 2021-05-16 ENCOUNTER — Encounter (HOSPITAL_BASED_OUTPATIENT_CLINIC_OR_DEPARTMENT_OTHER): Payer: Self-pay | Admitting: Urology

## 2021-05-16 NOTE — Progress Notes (Signed)
Spoke w/ via phone for pre-op interview--- pt ?Lab needs dos----  [facility], ekg             ?Lab results------ no ?COVID test -----patient states asymptomatic no test needed ?Arrive at ------- 0530 on 05-20-2021 ?NPO after MN NO Solid Food.  Clear liquids from MN until--- 0430 ?Med rec completed ?Medications to take morning of surgery ----- diltiazem, lipitor ?Diabetic medication ----- n/a ?Patient instructed no nail polish to be worn day of surgery ?Patient instructed to bring photo id and insurance card day of surgery ?Patient aware to have Driver (ride ) / caregiver for 24 hours after surgery --wife, linda ?Patient Special Instructions ----- will on fleet enema night before surgery ?Pre-Op special Istructions ----- n/a ?Patient verbalized understanding of instructions that were given at this phone interview. ?Patient denies shortness of breath, chest pain, fever, cough at this phone interview.  ?

## 2021-05-19 ENCOUNTER — Telehealth: Payer: Self-pay | Admitting: *Deleted

## 2021-05-19 NOTE — Telephone Encounter (Signed)
CALLED PATIENT TO REMIND OF SIM APPT. FOR 05-22-21- ARRIVAL TIME- 12:45 PM @ [facility], INFORMED PATIENT TO ARRIVE WITH A FULL BLADDER AND AN EMPTY BOWEL ?

## 2021-05-20 ENCOUNTER — Encounter (HOSPITAL_BASED_OUTPATIENT_CLINIC_OR_DEPARTMENT_OTHER)
Admission: RE | Disposition: A | Discharge status: Home / Self Care | Payer: Self-pay | Source: Home / Self Care | Attending: Urology

## 2021-05-20 ENCOUNTER — Encounter (HOSPITAL_BASED_OUTPATIENT_CLINIC_OR_DEPARTMENT_OTHER): Payer: Self-pay | Admitting: Urology

## 2021-05-20 ENCOUNTER — Ambulatory Visit (HOSPITAL_BASED_OUTPATIENT_CLINIC_OR_DEPARTMENT_OTHER): Payer: 59 | Admitting: Anesthesiology

## 2021-05-20 ENCOUNTER — Ambulatory Visit (HOSPITAL_BASED_OUTPATIENT_CLINIC_OR_DEPARTMENT_OTHER)
Admission: RE | Admit: 2021-05-20 | Discharge: 2021-05-20 | Disposition: A | Discharge status: Home / Self Care | Payer: 59 | Attending: Urology | Admitting: Urology

## 2021-05-20 DIAGNOSIS — Z7984 Long term (current) use of oral hypoglycemic drugs: Secondary | ICD-10-CM | POA: Diagnosis not present

## 2021-05-20 DIAGNOSIS — G4730 Sleep apnea, unspecified: Secondary | ICD-10-CM | POA: Insufficient documentation

## 2021-05-20 DIAGNOSIS — Z79899 Other long term (current) drug therapy: Secondary | ICD-10-CM | POA: Diagnosis not present

## 2021-05-20 DIAGNOSIS — Z9989 Dependence on other enabling machines and devices: Secondary | ICD-10-CM | POA: Diagnosis not present

## 2021-05-20 DIAGNOSIS — E119 Type 2 diabetes mellitus without complications: Secondary | ICD-10-CM | POA: Insufficient documentation

## 2021-05-20 DIAGNOSIS — I10 Essential (primary) hypertension: Secondary | ICD-10-CM | POA: Diagnosis not present

## 2021-05-20 DIAGNOSIS — G4733 Obstructive sleep apnea (adult) (pediatric): Secondary | ICD-10-CM | POA: Diagnosis not present

## 2021-05-20 DIAGNOSIS — C61 Malignant neoplasm of prostate: Secondary | ICD-10-CM | POA: Insufficient documentation

## 2021-05-20 DIAGNOSIS — Z01818 Encounter for other preprocedural examination: Secondary | ICD-10-CM

## 2021-05-20 HISTORY — PX: GOLD SEED IMPLANT: SHX6343

## 2021-05-20 HISTORY — PX: SPACE OAR INSTILLATION: SHX6769

## 2021-05-20 HISTORY — DX: Unspecified osteoarthritis, unspecified site: M19.90

## 2021-05-20 HISTORY — DX: Obstructive sleep apnea (adult) (pediatric): G47.33

## 2021-05-20 HISTORY — DX: Presence of spectacles and contact lenses: Z97.3

## 2021-05-20 HISTORY — DX: Hyperlipidemia, unspecified: E78.5

## 2021-05-20 HISTORY — DX: Type 2 diabetes mellitus without complications: E11.9

## 2021-05-20 LAB — POCT I-STAT, CHEM 8
BUN: 11 mg/dL (ref 8–23)
Calcium, Ion: 1.24 mmol/L (ref 1.15–1.40)
Chloride: 99 mmol/L (ref 98–111)
Creatinine, Ser: 0.8 mg/dL (ref 0.61–1.24)
Glucose, Bld: 144 mg/dL — ABNORMAL HIGH (ref 70–99)
HCT: 44 % (ref 39.0–52.0)
Hemoglobin: 15 g/dL (ref 13.0–17.0)
Potassium: 3.9 mmol/L (ref 3.5–5.1)
Sodium: 139 mmol/L (ref 135–145)
TCO2: 27 mmol/L (ref 22–32)

## 2021-05-20 LAB — GLUCOSE, CAPILLARY: Glucose-Capillary: 158 mg/dL — ABNORMAL HIGH (ref 70–99)

## 2021-05-20 SURGERY — INSERTION, GOLD SEEDS
Anesthesia: Monitor Anesthesia Care | Site: Prostate

## 2021-05-20 MED ORDER — FENTANYL CITRATE (PF) 100 MCG/2ML IJ SOLN: 25.0000 ug | INTRAMUSCULAR | Status: DC | PRN

## 2021-05-20 MED ORDER — ONDANSETRON HCL 4 MG/2ML IJ SOLN
INTRAMUSCULAR | Status: AC
  Filled 2021-05-20: qty 2

## 2021-05-20 MED ORDER — GLYCOPYRROLATE PF 0.2 MG/ML IJ SOSY
PREFILLED_SYRINGE | INTRAMUSCULAR | Status: DC | PRN
  Administered 2021-05-20: .1 mg via INTRAVENOUS

## 2021-05-20 MED ORDER — FENTANYL CITRATE (PF) 100 MCG/2ML IJ SOLN
INTRAMUSCULAR | Status: DC | PRN
  Administered 2021-05-20: 50 ug via INTRAVENOUS

## 2021-05-20 MED ORDER — MIDAZOLAM HCL 2 MG/2ML IJ SOLN
INTRAMUSCULAR | Status: AC
  Filled 2021-05-20: qty 2

## 2021-05-20 MED ORDER — FENTANYL CITRATE (PF) 100 MCG/2ML IJ SOLN
INTRAMUSCULAR | Status: AC
  Filled 2021-05-20: qty 2

## 2021-05-20 MED ORDER — SODIUM CHLORIDE (PF) 0.9 % IJ SOLN
INTRAMUSCULAR | Status: DC | PRN
  Administered 2021-05-20: 10 mL

## 2021-05-20 MED ORDER — MIDAZOLAM HCL 5 MG/5ML IJ SOLN
INTRAMUSCULAR | Status: DC | PRN
  Administered 2021-05-20: 2 mg via INTRAVENOUS

## 2021-05-20 MED ORDER — FLEET ENEMA 7-19 GM/118ML RE ENEM: 1.0000 | ENEMA | Freq: Once | RECTAL | Status: DC

## 2021-05-20 MED ORDER — PROPOFOL 500 MG/50ML IV EMUL
INTRAVENOUS | Status: AC
  Filled 2021-05-20: qty 50

## 2021-05-20 MED ORDER — PROPOFOL 1000 MG/100ML IV EMUL
INTRAVENOUS | Status: AC
Start: 2021-05-20 — End: ?
  Filled 2021-05-20: qty 100

## 2021-05-20 MED ORDER — ACETAMINOPHEN 10 MG/ML IV SOLN: 1000.0000 mg | Freq: Once | INTRAVENOUS | Status: DC | PRN

## 2021-05-20 MED ORDER — ONDANSETRON HCL 4 MG/2ML IJ SOLN
INTRAMUSCULAR | Status: DC | PRN
  Administered 2021-05-20: 4 mg via INTRAVENOUS

## 2021-05-20 MED ORDER — CEFAZOLIN SODIUM-DEXTROSE 2-4 GM/100ML-% IV SOLN
2.0000 g | INTRAVENOUS | Status: AC
  Administered 2021-05-20: 2 g via INTRAVENOUS

## 2021-05-20 MED ORDER — PROPOFOL 500 MG/50ML IV EMUL
INTRAVENOUS | Status: DC | PRN
  Administered 2021-05-20: 200 ug/kg/min via INTRAVENOUS

## 2021-05-20 MED ORDER — ONDANSETRON HCL 4 MG/2ML IJ SOLN: 4.0000 mg | Freq: Once | INTRAMUSCULAR | Status: DC | PRN

## 2021-05-20 MED ORDER — LACTATED RINGERS IV SOLN: INTRAVENOUS | Status: DC

## 2021-05-20 MED ORDER — CEFAZOLIN SODIUM-DEXTROSE 2-4 GM/100ML-% IV SOLN
INTRAVENOUS | Status: AC
  Filled 2021-05-20: qty 100

## 2021-05-20 MED ORDER — KETAMINE HCL 50 MG/5ML IJ SOSY
PREFILLED_SYRINGE | INTRAMUSCULAR | Status: AC
  Filled 2021-05-20: qty 5

## 2021-05-20 MED ORDER — PROPOFOL 10 MG/ML IV BOLUS
INTRAVENOUS | Status: DC | PRN
Start: 2021-05-20 — End: 2021-05-20
  Administered 2021-05-20: 20 mg via INTRAVENOUS

## 2021-05-20 MED ORDER — KETAMINE HCL 10 MG/ML IJ SOLN
INTRAMUSCULAR | Status: DC | PRN
  Administered 2021-05-20: 20 mg via INTRAVENOUS

## 2021-05-20 MED ORDER — GLYCOPYRROLATE PF 0.2 MG/ML IJ SOSY
PREFILLED_SYRINGE | INTRAMUSCULAR | Status: AC
  Filled 2021-05-20: qty 1

## 2021-05-20 MED ORDER — KETOROLAC TROMETHAMINE 15 MG/ML IJ SOLN: 15.0000 mg | Freq: Once | INTRAMUSCULAR | Status: DC | PRN

## 2021-05-20 MED ORDER — AMISULPRIDE (ANTIEMETIC) 5 MG/2ML IV SOLN: 10.0000 mg | Freq: Once | INTRAVENOUS | Status: DC | PRN

## 2021-05-20 SURGICAL SUPPLY — 25 items
BLADE CLIPPER SENSICLIP SURGIC (BLADE) ×3 IMPLANT
CNTNR URN SCR LID CUP LEK RST (MISCELLANEOUS) ×2 IMPLANT
CONT SPEC 4OZ STRL OR WHT (MISCELLANEOUS) ×3
COVER BACK TABLE 60X90IN (DRAPES) ×3 IMPLANT
DRSG TEGADERM 4X4.75 (GAUZE/BANDAGES/DRESSINGS) ×3 IMPLANT
DRSG TEGADERM 8X12 (GAUZE/BANDAGES/DRESSINGS) ×3 IMPLANT
GAUZE SPONGE 4X4 12PLY STRL (GAUZE/BANDAGES/DRESSINGS) ×3 IMPLANT
GAUZE SPONGE 4X4 12PLY STRL LF (GAUZE/BANDAGES/DRESSINGS) ×1 IMPLANT
GLOVE BIO SURGEON STRL SZ7.5 (GLOVE) ×3 IMPLANT
GLOVE ECLIPSE 8.0 STRL XLNG CF (GLOVE) ×3 IMPLANT
IMPL SPACEOAR VUE SYSTEM (Spacer) IMPLANT
IMPLANT SPACEOAR VUE SYSTEM (Spacer) ×3 IMPLANT
KIT TURNOVER CYSTO (KITS) ×3 IMPLANT
MARKER GOLD PRELOAD 1.2X3 (Urological Implant) ×2 IMPLANT
MARKER SKIN DUAL TIP RULER LAB (MISCELLANEOUS) ×3 IMPLANT
NDL SPNL 22GX3.5 QUINCKE BK (NEEDLE) ×2 IMPLANT
NEEDLE SPNL 22GX3.5 QUINCKE BK (NEEDLE) ×3 IMPLANT
SEED GOLD PRELOAD 1.2X3 (Urological Implant) ×3 IMPLANT
SHEATH ULTRASOUND LF (SHEATH) IMPLANT
SHEATH ULTRASOUND LTX NONSTRL (SHEATH) IMPLANT
SURGILUBE 2OZ TUBE FLIPTOP (MISCELLANEOUS) ×3 IMPLANT
SYR 10ML LL (SYRINGE) IMPLANT
SYR CONTROL 10ML LL (SYRINGE) ×3 IMPLANT
TOWEL OR 17X26 10 PK STRL BLUE (TOWEL DISPOSABLE) ×3 IMPLANT
UNDERPAD 30X36 HEAVY ABSORB (UNDERPADS AND DIAPERS) ×3 IMPLANT

## 2021-05-20 NOTE — Transfer of Care (Signed)
Immediate Anesthesia Transfer of Care Note ? ?Patient: Francisco Sanders ? ?Procedure(s) Performed: GOLD SEED IMPLANT (Prostate) ?SPACE OAR INSTILLATION ? ?Patient Location: PACU ? ?Anesthesia Type:MAC ? ?Level of Consciousness: awake, alert  and oriented ? ?Airway & Oxygen Therapy: Patient Spontanous Breathing ? ?Post-op Assessment: Report given to RN and Post -op Vital signs reviewed and stable ? ?Post vital signs: Reviewed and stable ? ?Last Vitals:  ?Vitals Value Taken Time  ?BP 138/90 05/20/21 0816  ?Temp    ?Pulse 89 05/20/21 0818  ?Resp 25 05/20/21 0818  ?SpO2 97 % 05/20/21 0818  ?Vitals shown include unvalidated device data. ? ?Last Pain:  ?Vitals:  ? 05/20/21 0556  ?TempSrc: Oral  ?PainSc: 0-No pain  ?   ? ?Patients Stated Pain Goal: 5 (05/20/21 0556) ? ?Complications: No notable events documented. ?

## 2021-05-20 NOTE — Interval H&P Note (Signed)
History and Physical Interval Note: ? ?05/20/2021 ?7:04 AM ? ?Francisco Sanders  has presented today for surgery, with the diagnosis of PROSTATE CANCER.  The various methods of treatment have been discussed with the patient and family. After consideration of risks, benefits and other options for treatment, the patient has consented to  Procedure(s) with comments: ?GOLD SEED IMPLANT (N/A) ?SPACE OAR INSTILLATION (N/A) - 30 MINS FOR THIS CASE as a surgical intervention.  The patient's history has been reviewed, patient examined, no change in status, stable for surgery.  I have reviewed the patient's chart and labs.  Questions were answered to the patient's satisfaction.   ? ? ?[place] ? ? ?

## 2021-05-20 NOTE — Anesthesia Preprocedure Evaluation (Addendum)
Anesthesia Evaluation  ?Patient identified by MRN, date of birth, ID band ?Patient awake ? ? ? ?Reviewed: ?Allergy & Precautions, NPO status , Patient's Chart, lab work & pertinent test results ? ?Airway ?Mallampati: III ? ?TM Distance: >3 FB ?Neck ROM: Full ? ? ? Dental ?no notable dental hx. ? ?  ?Pulmonary ?sleep apnea and Continuous Positive Airway Pressure Ventilation ,  ?  ?Pulmonary exam normal ? ? ? ? ? ? ? Cardiovascular ?hypertension, Pt. on medications ?Normal cardiovascular exam ? ?ECG: NSR, rate 75 ?  ?Neuro/Psych ?negative neurological ROS ? negative psych ROS  ? GI/Hepatic ?negative GI ROS, (+)  ?  ? substance abuse ? ,   ?Endo/Other  ?diabetes, Oral Hypoglycemic Agents ? Renal/GU ?negative Renal ROS  ? ?  ?Musculoskeletal ? ?(+) Arthritis ,  ? Abdominal ?(+) + obese,   ?Peds ? Hematology ?  ?Anesthesia Other Findings ?PROSTATE CANCER ? Reproductive/Obstetrics ? ?  ? ? ? ? ? ? ? ? ? ? ? ? ? ?  ?  ? ? ? ? ? ? ? ?Anesthesia Physical ?Anesthesia Plan ? ?ASA: 3 ? ?Anesthesia Plan: MAC  ? ?Post-op Pain Management:   ? ?Induction: Intravenous ? ?PONV Risk Score and Plan: 1 and Ondansetron, Dexamethasone, Propofol infusion, Midazolam and Treatment may vary due to age or medical condition ? ?Airway Management Planned: Simple Face Mask ? ?Additional Equipment:  ? ?Intra-op Plan:  ? ?Post-operative Plan:  ? ?Informed Consent: I have reviewed the patients History and Physical, chart, labs and discussed the procedure including the risks, benefits and alternatives for the proposed anesthesia with the patient or authorized representative who has indicated his/her understanding and acceptance.  ? ? ? ?Dental advisory given ? ?Plan Discussed with: CRNA ? ?Anesthesia Plan Comments:   ? ? ? ? ? ?Anesthesia Quick Evaluation ? ?

## 2021-05-20 NOTE — Discharge Instructions (Addendum)
You should avoid strenuous activities today but may resume all normal activities tomorrow. ? ?2.   You can take Tylenol as needed for any pain or discomfort. ? ?3.    Follow up with your radiation oncologist for your simulation appointment as scheduled.  If this is not currently scheduled or you do not know the date/time for that appointment, please contact the radiation oncology office to confirm. ? ?Post Anesthesia Home Care Instructions ? ?Activity: ?Get plenty of rest for the remainder of the day. A responsible adult should stay with you for 24 hours following the procedure.  ?For the next 24 hours, DO NOT: ?-Drive a car ?-Paediatric nurse ?-Drink alcoholic beverages ?-Take any medication unless instructed by your physician ?-Make any legal decisions or sign important papers. ? ?Meals: ?Start with liquid foods such as gelatin or soup. Progress to regular foods as tolerated. Avoid greasy, spicy, heavy foods. If nausea and/or vomiting occur, drink only clear liquids until the nausea and/or vomiting subsides. Call your physician if vomiting continues. ? ?Special Instructions/Symptoms: ?Your throat may feel dry or sore from the anesthesia or the breathing tube placed in your throat during surgery. If this causes discomfort, gargle with warm salt water. The discomfort should disappear within 24 hours. ? ?If you had a scopolamine patch placed behind your ear for the management of post- operative nausea and/or vomiting: ? ?1. The medication in the patch is effective for 72 hours, after which it should be removed.  Wrap patch in a tissue and discard in the trash. Wash hands thoroughly with soap and water. ?2. You may remove the patch earlier than 72 hours if you experience unpleasant side effects which may include dry mouth, dizziness or visual disturbances. ?3. Avoid touching the patch. Wash your hands with soap and water after contact with the patch. ?   ?

## 2021-05-20 NOTE — Anesthesia Postprocedure Evaluation (Signed)
Anesthesia Post Note ? ?Patient: JACARIE PATE ? ?Procedure(s) Performed: GOLD SEED IMPLANT (Prostate) ?SPACE OAR INSTILLATION ? ?  ? ?Patient location during evaluation: PACU ?Anesthesia Type: MAC ?Level of consciousness: awake ?Pain management: pain level controlled ?Vital Signs Assessment: post-procedure vital signs reviewed and stable ?Respiratory status: spontaneous breathing, nonlabored ventilation, respiratory function stable and patient connected to nasal cannula oxygen ?Cardiovascular status: stable and blood pressure returned to baseline ?Postop Assessment: no apparent nausea or vomiting ?Anesthetic complications: no ? ? ?No notable events documented. ? ?Last Vitals:  ?Vitals:  ? 05/20/21 0830 05/20/21 0900  ?BP: (!) 131/92 (!) 146/89  ?Pulse: 83 77  ?Resp: 14 16  ?Temp:  (!) 36.3 ?C  ?SpO2: 98% 97%  ?  ?Last Pain:  ?Vitals:  ? 05/20/21 0900  ?TempSrc:   ?PainSc: 0-No pain  ? ? ?  ?  ?  ?  ?  ?  ? ?Daffney Greenly P Saesha Llerenas ? ? ? ? ?

## 2021-05-20 NOTE — Op Note (Signed)
Preoperative diagnosis: Clinically localized adenocarcinoma of the prostate  ? ?Postoperative diagnosis: Clinically localized adenocarcinoma of the prostate ? ?Procedure: 1) Placement of fiducial markers into prostate ?                   2) Insertion of SpaceOAR hydrogel  ? ?Surgeon:Luke Abbeygail Igoe. M.D. ? ?Anesthesia: General ? ?EBL: Minimal ? ?Complications: None ? ?Indication: Francisco Sanders is a 66 y.o. gentleman with clinically localized prostate cancer. After discussing management options for treatment, he elected to proceed with radiotherapy. He presents today for the above procedures. The potential risks, complications, alternative options, and expected recovery course have been discussed in detail with the patient and he has provided informed consent to proceed. ? ?Description of procedure: The patient was administered preoperative antibiotics, placed in the dorsal lithotomy position, and prepped and draped in the usual sterile fashion. Next, transrectal ultrasonography was utilized to visualize the prostate. Three gold fiducial markers were then placed into the prostate via transperineal needles under ultrasound guidance at the left apex, left base, and right mid gland under direct ultrasound guidance. A site in the midline was then selected on the perineum for placement of an 18 g needle with saline. The needle was advanced above the rectum and below Denonvillier's fascia to the mid gland and confirmed to be in the midline on transverse imaging. One cc of saline was injected confirming appropriate expansion of this space. A total of 5 cc of saline was then injected to open the space further bilaterally. The saline syringe was then removed and the SpaceOAR hydrogel was injected with good distribution bilaterally. He tolerated the procedure well and without complications. He was given a voiding trial prior to discharge from the PACU.  ?

## 2021-05-21 ENCOUNTER — Encounter (HOSPITAL_BASED_OUTPATIENT_CLINIC_OR_DEPARTMENT_OTHER): Payer: Self-pay | Admitting: Urology

## 2021-05-21 ENCOUNTER — Telehealth: Payer: Self-pay | Admitting: *Deleted

## 2021-05-21 NOTE — Telephone Encounter (Signed)
CALLED PATIENT TO REMIND OF SIM APPT. FOR 05-22-21- ARRIVAL TIME- 12:45 PM @ [facility], INFORMED PATIENT TO ARRIVE WITH A FULL BLADDER AND AN EMPTY BOWEL, SPOKE WITH PATIENT AND HE IS AWARE OF THIS APPT. AND THE INSTRUCTIONS ?

## 2021-05-22 ENCOUNTER — Ambulatory Visit: Payer: 59 | Admitting: Radiation Oncology

## 2021-05-22 NOTE — Progress Notes (Incomplete)
  Radiation Oncology         (336) [phone] ________________________________  Name: Francisco Sanders MRN: [id number]  Date: 05/22/2021  DOB: 1955/12/27  SIMULATION AND TREATMENT PLANNING NOTE    ICD-10-CM   1. Malignant neoplasm of prostate ([place])  C61       DIAGNOSIS:  66 y.o. gentleman with Stage T1c adenocarcinoma of the prostate with Gleason Score of 4+3, and PSA of 5.52.  NARRATIVE:  The patient was brought to the [facility].  Identity was confirmed.  All relevant records and images related to the planned course of therapy were reviewed.  The patient freely provided informed written consent to proceed with treatment after reviewing the details related to the planned course of therapy. The consent form was witnessed and verified by the simulation staff.  Then, the patient was set-up in a stable reproducible supine position for radiation therapy.  A vacuum lock pillow device was custom fabricated to position his legs in a reproducible immobilized position.  Then, I performed a urethrogram under sterile conditions to identify the prostatic apex.  CT images were obtained.  Surface markings were placed.  The CT images were loaded into the planning software.  Then the prostate target and avoidance structures including the rectum, bladder, bowel and hips were contoured.  Treatment planning then occurred.  The radiation prescription was entered and confirmed.  A total of one complex treatment devices was fabricated. I have requested : Intensity Modulated Radiotherapy (IMRT) is medically necessary for this case for the following reason:  Rectal sparing.Marland Kitchen  PLAN:  The patient will receive 70 Gy in 28 fractions.  ________________________________  Sheral Apley Francisco Sanders, M.D.

## 2021-05-27 ENCOUNTER — Telehealth: Payer: Self-pay | Admitting: *Deleted

## 2021-05-27 NOTE — Telephone Encounter (Signed)
CALLED PATIENT TO REMIND OF SIM APPT. FOR 05-30-21- ARRIVAL TIME- 2:45 PM, INFORMED PATIENT TO ARRIVE WITH A FULL BLADDER AND AN EMPTY BOWEL, LVM FOR A RETURN CALL

## 2021-05-29 NOTE — Progress Notes (Signed)
  Radiation Oncology         (336) [phone] ________________________________  Name: Francisco Sanders MRN: [id number]  Date: 05/30/2021  DOB: 1955-08-13  SIMULATION AND TREATMENT PLANNING NOTE    ICD-10-CM   1. Malignant neoplasm of prostate ([place])  C61       DIAGNOSIS:  66 y.o. gentleman with Stage T1c adenocarcinoma of the prostate with Gleason score of 4+3, and PSA of 5.52  NARRATIVE:  The patient was brought to the [facility].  Identity was confirmed.  All relevant records and images related to the planned course of therapy were reviewed.  The patient freely provided informed written consent to proceed with treatment after reviewing the details related to the planned course of therapy. The consent form was witnessed and verified by the simulation staff.  Then, the patient was set-up in a stable reproducible supine position for radiation therapy.  A vacuum lock pillow device was custom fabricated to position his legs in a reproducible immobilized position.  Then, supervised the performance of a urethrogram under sterile conditions to identify the prostatic apex.  CT images were obtained.  Surface markings were placed.  The CT images were loaded into the planning software.  Then the prostate target and avoidance structures including the rectum, bladder, bowel and hips were contoured.  Treatment planning then occurred.  The radiation prescription was entered and confirmed.  A total of one complex treatment devices was fabricated. I have requested : Intensity Modulated Radiotherapy (IMRT) is medically necessary for this case for the following reason:  Rectal sparing.  I have requested daily cone beam CT volumetric image gudiance to track gold fiducial posiitoning along with bladder and rectal filling, this is medically necessary to assure accurate positioning of high dose radiation.  PLAN:  The patient will receive 70 Gy in 28 fractions.  ________________________________  Sheral Apley  Tammi Klippel, M.D.

## 2021-05-30 ENCOUNTER — Ambulatory Visit
Admission: RE | Admit: 2021-05-30 | Discharge: 2021-05-30 | Disposition: A | Discharge status: Home / Self Care | Payer: 59 | Source: Ambulatory Visit | Attending: Radiation Oncology | Admitting: Radiation Oncology

## 2021-05-30 ENCOUNTER — Other Ambulatory Visit: Payer: Self-pay

## 2021-05-30 DIAGNOSIS — C61 Malignant neoplasm of prostate: Secondary | ICD-10-CM | POA: Diagnosis present

## 2021-06-03 ENCOUNTER — Ambulatory Visit: Payer: 59

## 2021-06-03 DIAGNOSIS — C61 Malignant neoplasm of prostate: Secondary | ICD-10-CM | POA: Diagnosis not present

## 2021-06-04 ENCOUNTER — Ambulatory Visit: Payer: 59

## 2021-06-05 ENCOUNTER — Ambulatory Visit: Payer: 59

## 2021-06-06 ENCOUNTER — Ambulatory Visit: Payer: 59

## 2021-06-09 ENCOUNTER — Ambulatory Visit: Payer: 59

## 2021-06-10 ENCOUNTER — Ambulatory Visit: Payer: 59

## 2021-06-11 ENCOUNTER — Other Ambulatory Visit: Payer: Self-pay

## 2021-06-11 ENCOUNTER — Ambulatory Visit
Admission: RE | Admit: 2021-06-11 | Discharge: 2021-06-11 | Disposition: A | Discharge status: Home / Self Care | Payer: 59 | Source: Ambulatory Visit | Attending: Radiation Oncology | Admitting: Radiation Oncology

## 2021-06-11 DIAGNOSIS — C61 Malignant neoplasm of prostate: Secondary | ICD-10-CM | POA: Diagnosis present

## 2021-06-11 LAB — RAD ONC ARIA SESSION SUMMARY
Course Elapsed Days: 0
Plan Fractions Treated to Date: 1
Plan Prescribed Dose Per Fraction: 2.5 Gy
Plan Total Fractions Prescribed: 28
Plan Total Prescribed Dose: 70 Gy
Reference Point Dosage Given to Date: 2.5 Gy
Reference Point Session Dosage Given: 2.5 Gy
Session Number: 1

## 2021-06-12 ENCOUNTER — Other Ambulatory Visit: Payer: Self-pay

## 2021-06-12 ENCOUNTER — Ambulatory Visit
Admission: RE | Admit: 2021-06-12 | Discharge: 2021-06-12 | Disposition: A | Discharge status: Home / Self Care | Payer: 59 | Source: Ambulatory Visit | Attending: Radiation Oncology | Admitting: Radiation Oncology

## 2021-06-12 ENCOUNTER — Ambulatory Visit: Admission: RE | Admit: 2021-06-12 | Payer: 59 | Source: Ambulatory Visit

## 2021-06-12 DIAGNOSIS — C61 Malignant neoplasm of prostate: Secondary | ICD-10-CM | POA: Diagnosis not present

## 2021-06-12 LAB — RAD ONC ARIA SESSION SUMMARY
Course Elapsed Days: 1
Plan Fractions Treated to Date: 2
Plan Prescribed Dose Per Fraction: 2.5 Gy
Plan Total Fractions Prescribed: 28
Plan Total Prescribed Dose: 70 Gy
Reference Point Dosage Given to Date: 5 Gy
Reference Point Session Dosage Given: 2.5 Gy
Session Number: 2

## 2021-06-13 ENCOUNTER — Ambulatory Visit
Admission: RE | Admit: 2021-06-13 | Discharge: 2021-06-13 | Disposition: A | Discharge status: Home / Self Care | Payer: 59 | Source: Ambulatory Visit | Attending: Radiation Oncology | Admitting: Radiation Oncology

## 2021-06-13 ENCOUNTER — Other Ambulatory Visit: Payer: Self-pay

## 2021-06-13 DIAGNOSIS — C61 Malignant neoplasm of prostate: Secondary | ICD-10-CM | POA: Diagnosis not present

## 2021-06-13 LAB — RAD ONC ARIA SESSION SUMMARY
Course Elapsed Days: 2
Plan Fractions Treated to Date: 3
Plan Prescribed Dose Per Fraction: 2.5 Gy
Plan Total Fractions Prescribed: 28
Plan Total Prescribed Dose: 70 Gy
Reference Point Dosage Given to Date: 7.5 Gy
Reference Point Session Dosage Given: 2.5 Gy
Session Number: 3

## 2021-06-16 ENCOUNTER — Other Ambulatory Visit: Payer: Self-pay

## 2021-06-16 ENCOUNTER — Ambulatory Visit
Admission: RE | Admit: 2021-06-16 | Discharge: 2021-06-16 | Disposition: A | Discharge status: Home / Self Care | Payer: 59 | Source: Ambulatory Visit | Attending: Radiation Oncology | Admitting: Radiation Oncology

## 2021-06-16 DIAGNOSIS — C61 Malignant neoplasm of prostate: Secondary | ICD-10-CM | POA: Diagnosis not present

## 2021-06-16 LAB — RAD ONC ARIA SESSION SUMMARY
Course Elapsed Days: 5
Plan Fractions Treated to Date: 4
Plan Prescribed Dose Per Fraction: 2.5 Gy
Plan Total Fractions Prescribed: 28
Plan Total Prescribed Dose: 70 Gy
Reference Point Dosage Given to Date: 10 Gy
Reference Point Session Dosage Given: 2.5 Gy
Session Number: 4

## 2021-06-17 ENCOUNTER — Other Ambulatory Visit: Payer: Self-pay

## 2021-06-17 ENCOUNTER — Ambulatory Visit
Admission: RE | Admit: 2021-06-17 | Discharge: 2021-06-17 | Disposition: A | Discharge status: Home / Self Care | Payer: 59 | Source: Ambulatory Visit | Attending: Radiation Oncology | Admitting: Radiation Oncology

## 2021-06-17 DIAGNOSIS — C61 Malignant neoplasm of prostate: Secondary | ICD-10-CM | POA: Diagnosis not present

## 2021-06-17 LAB — RAD ONC ARIA SESSION SUMMARY
Course Elapsed Days: 6
Plan Fractions Treated to Date: 5
Plan Prescribed Dose Per Fraction: 2.5 Gy
Plan Total Fractions Prescribed: 28
Plan Total Prescribed Dose: 70 Gy
Reference Point Dosage Given to Date: 12.5 Gy
Reference Point Session Dosage Given: 2.5 Gy
Session Number: 5

## 2021-06-18 ENCOUNTER — Other Ambulatory Visit: Payer: Self-pay

## 2021-06-18 ENCOUNTER — Ambulatory Visit
Admission: RE | Admit: 2021-06-18 | Discharge: 2021-06-18 | Disposition: A | Discharge status: Home / Self Care | Payer: 59 | Source: Ambulatory Visit | Attending: Radiation Oncology | Admitting: Radiation Oncology

## 2021-06-18 DIAGNOSIS — C61 Malignant neoplasm of prostate: Secondary | ICD-10-CM | POA: Diagnosis not present

## 2021-06-18 LAB — RAD ONC ARIA SESSION SUMMARY
Course Elapsed Days: 7
Plan Fractions Treated to Date: 6
Plan Prescribed Dose Per Fraction: 2.5 Gy
Plan Total Fractions Prescribed: 28
Plan Total Prescribed Dose: 70 Gy
Reference Point Dosage Given to Date: 15 Gy
Reference Point Session Dosage Given: 2.5 Gy
Session Number: 6

## 2021-06-19 ENCOUNTER — Ambulatory Visit
Admission: RE | Admit: 2021-06-19 | Discharge: 2021-06-19 | Disposition: A | Discharge status: Home / Self Care | Payer: 59 | Source: Ambulatory Visit | Attending: Radiation Oncology | Admitting: Radiation Oncology

## 2021-06-19 ENCOUNTER — Other Ambulatory Visit: Payer: Self-pay

## 2021-06-19 DIAGNOSIS — C61 Malignant neoplasm of prostate: Secondary | ICD-10-CM | POA: Diagnosis not present

## 2021-06-19 LAB — RAD ONC ARIA SESSION SUMMARY
Course Elapsed Days: 8
Plan Fractions Treated to Date: 7
Plan Prescribed Dose Per Fraction: 2.5 Gy
Plan Total Fractions Prescribed: 28
Plan Total Prescribed Dose: 70 Gy
Reference Point Dosage Given to Date: 17.5 Gy
Reference Point Session Dosage Given: 2.5 Gy
Session Number: 7

## 2021-06-20 ENCOUNTER — Other Ambulatory Visit: Payer: Self-pay

## 2021-06-20 ENCOUNTER — Ambulatory Visit
Admission: RE | Admit: 2021-06-20 | Discharge: 2021-06-20 | Disposition: A | Discharge status: Home / Self Care | Payer: 59 | Source: Ambulatory Visit | Attending: Radiation Oncology | Admitting: Radiation Oncology

## 2021-06-20 DIAGNOSIS — C61 Malignant neoplasm of prostate: Secondary | ICD-10-CM | POA: Diagnosis not present

## 2021-06-20 LAB — RAD ONC ARIA SESSION SUMMARY
Course Elapsed Days: 9
Plan Fractions Treated to Date: 8
Plan Prescribed Dose Per Fraction: 2.5 Gy
Plan Total Fractions Prescribed: 28
Plan Total Prescribed Dose: 70 Gy
Reference Point Dosage Given to Date: 20 Gy
Reference Point Session Dosage Given: 2.5 Gy
Session Number: 8

## 2021-06-23 ENCOUNTER — Other Ambulatory Visit: Payer: Self-pay

## 2021-06-23 ENCOUNTER — Ambulatory Visit
Admission: RE | Admit: 2021-06-23 | Discharge: 2021-06-23 | Disposition: A | Discharge status: Home / Self Care | Payer: 59 | Source: Ambulatory Visit | Attending: Radiation Oncology | Admitting: Radiation Oncology

## 2021-06-23 DIAGNOSIS — C61 Malignant neoplasm of prostate: Secondary | ICD-10-CM | POA: Diagnosis not present

## 2021-06-23 LAB — RAD ONC ARIA SESSION SUMMARY
Course Elapsed Days: 12
Plan Fractions Treated to Date: 9
Plan Prescribed Dose Per Fraction: 2.5 Gy
Plan Total Fractions Prescribed: 28
Plan Total Prescribed Dose: 70 Gy
Reference Point Dosage Given to Date: 22.5 Gy
Reference Point Session Dosage Given: 2.5 Gy
Session Number: 9

## 2021-06-24 ENCOUNTER — Ambulatory Visit
Admission: RE | Admit: 2021-06-24 | Discharge: 2021-06-24 | Disposition: A | Discharge status: Home / Self Care | Payer: 59 | Source: Ambulatory Visit | Attending: Radiation Oncology | Admitting: Radiation Oncology

## 2021-06-24 ENCOUNTER — Other Ambulatory Visit: Payer: Self-pay

## 2021-06-24 DIAGNOSIS — C61 Malignant neoplasm of prostate: Secondary | ICD-10-CM | POA: Diagnosis not present

## 2021-06-24 LAB — RAD ONC ARIA SESSION SUMMARY
Course Elapsed Days: 13
Plan Fractions Treated to Date: 10
Plan Prescribed Dose Per Fraction: 2.5 Gy
Plan Total Fractions Prescribed: 28
Plan Total Prescribed Dose: 70 Gy
Reference Point Dosage Given to Date: 25 Gy
Reference Point Session Dosage Given: 2.5 Gy
Session Number: 10

## 2021-06-25 ENCOUNTER — Ambulatory Visit
Admission: RE | Admit: 2021-06-25 | Discharge: 2021-06-25 | Disposition: A | Discharge status: Home / Self Care | Payer: 59 | Source: Ambulatory Visit | Attending: Radiation Oncology | Admitting: Radiation Oncology

## 2021-06-25 ENCOUNTER — Other Ambulatory Visit: Payer: Self-pay

## 2021-06-25 DIAGNOSIS — C61 Malignant neoplasm of prostate: Secondary | ICD-10-CM | POA: Diagnosis not present

## 2021-06-25 LAB — RAD ONC ARIA SESSION SUMMARY
Course Elapsed Days: 14
Plan Fractions Treated to Date: 11
Plan Prescribed Dose Per Fraction: 2.5 Gy
Plan Total Fractions Prescribed: 28
Plan Total Prescribed Dose: 70 Gy
Reference Point Dosage Given to Date: 27.5 Gy
Reference Point Session Dosage Given: 2.5 Gy
Session Number: 11

## 2021-06-26 ENCOUNTER — Ambulatory Visit
Admission: RE | Admit: 2021-06-26 | Discharge: 2021-06-26 | Disposition: A | Discharge status: Home / Self Care | Payer: 59 | Source: Ambulatory Visit | Attending: Radiation Oncology | Admitting: Radiation Oncology

## 2021-06-26 ENCOUNTER — Other Ambulatory Visit: Payer: Self-pay

## 2021-06-26 DIAGNOSIS — C61 Malignant neoplasm of prostate: Secondary | ICD-10-CM | POA: Diagnosis not present

## 2021-06-26 LAB — RAD ONC ARIA SESSION SUMMARY
Course Elapsed Days: 15
Plan Fractions Treated to Date: 12
Plan Prescribed Dose Per Fraction: 2.5 Gy
Plan Total Fractions Prescribed: 28
Plan Total Prescribed Dose: 70 Gy
Reference Point Dosage Given to Date: 30 Gy
Reference Point Session Dosage Given: 2.5 Gy
Session Number: 12

## 2021-06-27 ENCOUNTER — Other Ambulatory Visit: Payer: Self-pay

## 2021-06-27 ENCOUNTER — Ambulatory Visit
Admission: RE | Admit: 2021-06-27 | Discharge: 2021-06-27 | Disposition: A | Discharge status: Home / Self Care | Payer: 59 | Source: Ambulatory Visit | Attending: Radiation Oncology | Admitting: Radiation Oncology

## 2021-06-27 DIAGNOSIS — C61 Malignant neoplasm of prostate: Secondary | ICD-10-CM | POA: Diagnosis not present

## 2021-06-27 LAB — RAD ONC ARIA SESSION SUMMARY
Course Elapsed Days: 16
Plan Fractions Treated to Date: 13
Plan Prescribed Dose Per Fraction: 2.5 Gy
Plan Total Fractions Prescribed: 28
Plan Total Prescribed Dose: 70 Gy
Reference Point Dosage Given to Date: 32.5 Gy
Reference Point Session Dosage Given: 2.5 Gy
Session Number: 13

## 2021-06-30 ENCOUNTER — Other Ambulatory Visit: Payer: Self-pay

## 2021-06-30 ENCOUNTER — Ambulatory Visit
Admission: RE | Admit: 2021-06-30 | Discharge: 2021-06-30 | Disposition: A | Discharge status: Home / Self Care | Payer: 59 | Source: Ambulatory Visit | Attending: Radiation Oncology | Admitting: Radiation Oncology

## 2021-06-30 DIAGNOSIS — C61 Malignant neoplasm of prostate: Secondary | ICD-10-CM | POA: Diagnosis not present

## 2021-06-30 LAB — RAD ONC ARIA SESSION SUMMARY
Course Elapsed Days: 19
Plan Fractions Treated to Date: 14
Plan Prescribed Dose Per Fraction: 2.5 Gy
Plan Total Fractions Prescribed: 28
Plan Total Prescribed Dose: 70 Gy
Reference Point Dosage Given to Date: 35 Gy
Reference Point Session Dosage Given: 2.5 Gy
Session Number: 14

## 2021-07-01 ENCOUNTER — Other Ambulatory Visit: Payer: Self-pay

## 2021-07-01 ENCOUNTER — Ambulatory Visit
Admission: RE | Admit: 2021-07-01 | Discharge: 2021-07-01 | Disposition: A | Discharge status: Home / Self Care | Payer: 59 | Source: Ambulatory Visit | Attending: Radiation Oncology | Admitting: Radiation Oncology

## 2021-07-01 DIAGNOSIS — C61 Malignant neoplasm of prostate: Secondary | ICD-10-CM | POA: Diagnosis not present

## 2021-07-01 LAB — RAD ONC ARIA SESSION SUMMARY
Course Elapsed Days: 20
Plan Fractions Treated to Date: 15
Plan Prescribed Dose Per Fraction: 2.5 Gy
Plan Total Fractions Prescribed: 28
Plan Total Prescribed Dose: 70 Gy
Reference Point Dosage Given to Date: 37.5 Gy
Reference Point Session Dosage Given: 2.5 Gy
Session Number: 15

## 2021-07-02 ENCOUNTER — Other Ambulatory Visit: Payer: Self-pay

## 2021-07-02 ENCOUNTER — Ambulatory Visit
Admission: RE | Admit: 2021-07-02 | Discharge: 2021-07-02 | Disposition: A | Discharge status: Home / Self Care | Payer: 59 | Source: Ambulatory Visit | Attending: Radiation Oncology | Admitting: Radiation Oncology

## 2021-07-02 DIAGNOSIS — C61 Malignant neoplasm of prostate: Secondary | ICD-10-CM | POA: Diagnosis not present

## 2021-07-02 LAB — RAD ONC ARIA SESSION SUMMARY
Course Elapsed Days: 21
Plan Fractions Treated to Date: 16
Plan Prescribed Dose Per Fraction: 2.5 Gy
Plan Total Fractions Prescribed: 28
Plan Total Prescribed Dose: 70 Gy
Reference Point Dosage Given to Date: 40 Gy
Reference Point Session Dosage Given: 2.5 Gy
Session Number: 16

## 2021-07-03 ENCOUNTER — Ambulatory Visit
Admission: RE | Admit: 2021-07-03 | Discharge: 2021-07-03 | Disposition: A | Discharge status: Home / Self Care | Payer: 59 | Source: Ambulatory Visit | Attending: Radiation Oncology | Admitting: Radiation Oncology

## 2021-07-03 ENCOUNTER — Other Ambulatory Visit: Payer: Self-pay

## 2021-07-03 DIAGNOSIS — C61 Malignant neoplasm of prostate: Secondary | ICD-10-CM | POA: Diagnosis not present

## 2021-07-03 LAB — RAD ONC ARIA SESSION SUMMARY
Course Elapsed Days: 22
Plan Fractions Treated to Date: 17
Plan Prescribed Dose Per Fraction: 2.5 Gy
Plan Total Fractions Prescribed: 28
Plan Total Prescribed Dose: 70 Gy
Reference Point Dosage Given to Date: 42.5 Gy
Reference Point Session Dosage Given: 2.5 Gy
Session Number: 17

## 2021-07-04 ENCOUNTER — Ambulatory Visit
Admission: RE | Admit: 2021-07-04 | Discharge: 2021-07-04 | Disposition: A | Discharge status: Home / Self Care | Payer: 59 | Source: Ambulatory Visit | Attending: Radiation Oncology | Admitting: Radiation Oncology

## 2021-07-04 ENCOUNTER — Other Ambulatory Visit: Payer: Self-pay

## 2021-07-04 DIAGNOSIS — C61 Malignant neoplasm of prostate: Secondary | ICD-10-CM | POA: Diagnosis not present

## 2021-07-04 LAB — RAD ONC ARIA SESSION SUMMARY
Course Elapsed Days: 23
Plan Fractions Treated to Date: 18
Plan Prescribed Dose Per Fraction: 2.5 Gy
Plan Total Fractions Prescribed: 28
Plan Total Prescribed Dose: 70 Gy
Reference Point Dosage Given to Date: 45 Gy
Reference Point Session Dosage Given: 2.5 Gy
Session Number: 18

## 2021-07-07 ENCOUNTER — Other Ambulatory Visit: Payer: Self-pay

## 2021-07-07 ENCOUNTER — Ambulatory Visit
Admission: RE | Admit: 2021-07-07 | Discharge: 2021-07-07 | Disposition: A | Discharge status: Home / Self Care | Payer: 59 | Source: Ambulatory Visit | Attending: Radiation Oncology | Admitting: Radiation Oncology

## 2021-07-07 DIAGNOSIS — C61 Malignant neoplasm of prostate: Secondary | ICD-10-CM | POA: Diagnosis not present

## 2021-07-07 LAB — RAD ONC ARIA SESSION SUMMARY
Course Elapsed Days: 26
Plan Fractions Treated to Date: 19
Plan Prescribed Dose Per Fraction: 2.5 Gy
Plan Total Fractions Prescribed: 28
Plan Total Prescribed Dose: 70 Gy
Reference Point Dosage Given to Date: 47.5 Gy
Reference Point Session Dosage Given: 2.5 Gy
Session Number: 19

## 2021-07-09 ENCOUNTER — Ambulatory Visit
Admission: RE | Admit: 2021-07-09 | Discharge: 2021-07-09 | Disposition: A | Discharge status: Home / Self Care | Payer: 59 | Source: Ambulatory Visit | Attending: Radiation Oncology | Admitting: Radiation Oncology

## 2021-07-09 ENCOUNTER — Other Ambulatory Visit: Payer: Self-pay

## 2021-07-09 DIAGNOSIS — C61 Malignant neoplasm of prostate: Secondary | ICD-10-CM | POA: Diagnosis not present

## 2021-07-09 LAB — RAD ONC ARIA SESSION SUMMARY
Course Elapsed Days: 28
Plan Fractions Treated to Date: 20
Plan Prescribed Dose Per Fraction: 2.5 Gy
Plan Total Fractions Prescribed: 28
Plan Total Prescribed Dose: 70 Gy
Reference Point Dosage Given to Date: 50 Gy
Reference Point Session Dosage Given: 2.5 Gy
Session Number: 20

## 2021-07-10 ENCOUNTER — Other Ambulatory Visit: Payer: Self-pay

## 2021-07-10 ENCOUNTER — Ambulatory Visit
Admission: RE | Admit: 2021-07-10 | Discharge: 2021-07-10 | Disposition: A | Discharge status: Home / Self Care | Payer: 59 | Source: Ambulatory Visit | Attending: Radiation Oncology | Admitting: Radiation Oncology

## 2021-07-10 DIAGNOSIS — C61 Malignant neoplasm of prostate: Secondary | ICD-10-CM | POA: Diagnosis not present

## 2021-07-10 LAB — RAD ONC ARIA SESSION SUMMARY
Course Elapsed Days: 29
Plan Fractions Treated to Date: 21
Plan Prescribed Dose Per Fraction: 2.5 Gy
Plan Total Fractions Prescribed: 28
Plan Total Prescribed Dose: 70 Gy
Reference Point Dosage Given to Date: 52.5 Gy
Reference Point Session Dosage Given: 2.5 Gy
Session Number: 21

## 2021-07-11 ENCOUNTER — Other Ambulatory Visit: Payer: Self-pay

## 2021-07-11 ENCOUNTER — Ambulatory Visit
Admission: RE | Admit: 2021-07-11 | Discharge: 2021-07-11 | Disposition: A | Discharge status: Home / Self Care | Payer: 59 | Source: Ambulatory Visit | Attending: Radiation Oncology | Admitting: Radiation Oncology

## 2021-07-11 DIAGNOSIS — C61 Malignant neoplasm of prostate: Secondary | ICD-10-CM | POA: Diagnosis not present

## 2021-07-11 LAB — RAD ONC ARIA SESSION SUMMARY
Course Elapsed Days: 30
Plan Fractions Treated to Date: 22
Plan Prescribed Dose Per Fraction: 2.5 Gy
Plan Total Fractions Prescribed: 28
Plan Total Prescribed Dose: 70 Gy
Reference Point Dosage Given to Date: 55 Gy
Reference Point Session Dosage Given: 2.5 Gy
Session Number: 22

## 2021-07-14 ENCOUNTER — Other Ambulatory Visit: Payer: Self-pay

## 2021-07-14 ENCOUNTER — Ambulatory Visit
Admission: RE | Admit: 2021-07-14 | Discharge: 2021-07-14 | Disposition: A | Discharge status: Home / Self Care | Payer: 59 | Source: Ambulatory Visit | Attending: Radiation Oncology | Admitting: Radiation Oncology

## 2021-07-14 DIAGNOSIS — C61 Malignant neoplasm of prostate: Secondary | ICD-10-CM | POA: Diagnosis not present

## 2021-07-14 LAB — RAD ONC ARIA SESSION SUMMARY
Course Elapsed Days: 33
Plan Fractions Treated to Date: 23
Plan Prescribed Dose Per Fraction: 2.5 Gy
Plan Total Fractions Prescribed: 28
Plan Total Prescribed Dose: 70 Gy
Reference Point Dosage Given to Date: 57.5 Gy
Reference Point Session Dosage Given: 2.5 Gy
Session Number: 23

## 2021-07-15 ENCOUNTER — Ambulatory Visit
Admission: RE | Admit: 2021-07-15 | Discharge: 2021-07-15 | Disposition: A | Discharge status: Home / Self Care | Payer: 59 | Source: Ambulatory Visit | Attending: Radiation Oncology | Admitting: Radiation Oncology

## 2021-07-15 ENCOUNTER — Other Ambulatory Visit: Payer: Self-pay

## 2021-07-15 DIAGNOSIS — C61 Malignant neoplasm of prostate: Secondary | ICD-10-CM | POA: Diagnosis not present

## 2021-07-15 LAB — RAD ONC ARIA SESSION SUMMARY
Course Elapsed Days: 34
Plan Fractions Treated to Date: 24
Plan Prescribed Dose Per Fraction: 2.5 Gy
Plan Total Fractions Prescribed: 28
Plan Total Prescribed Dose: 70 Gy
Reference Point Dosage Given to Date: 60 Gy
Reference Point Session Dosage Given: 2.5 Gy
Session Number: 24

## 2021-07-16 ENCOUNTER — Ambulatory Visit
Admission: RE | Admit: 2021-07-16 | Discharge: 2021-07-16 | Disposition: A | Discharge status: Home / Self Care | Payer: 59 | Source: Ambulatory Visit | Attending: Radiation Oncology | Admitting: Radiation Oncology

## 2021-07-16 ENCOUNTER — Other Ambulatory Visit: Payer: Self-pay

## 2021-07-16 DIAGNOSIS — C61 Malignant neoplasm of prostate: Secondary | ICD-10-CM | POA: Diagnosis not present

## 2021-07-16 LAB — RAD ONC ARIA SESSION SUMMARY
Course Elapsed Days: 35
Plan Fractions Treated to Date: 25
Plan Prescribed Dose Per Fraction: 2.5 Gy
Plan Total Fractions Prescribed: 28
Plan Total Prescribed Dose: 70 Gy
Reference Point Dosage Given to Date: 62.5 Gy
Reference Point Session Dosage Given: 2.5 Gy
Session Number: 25

## 2021-07-17 ENCOUNTER — Other Ambulatory Visit: Payer: Self-pay

## 2021-07-17 ENCOUNTER — Ambulatory Visit
Admission: RE | Admit: 2021-07-17 | Discharge: 2021-07-17 | Disposition: A | Discharge status: Home / Self Care | Payer: 59 | Source: Ambulatory Visit | Attending: Radiation Oncology | Admitting: Radiation Oncology

## 2021-07-17 DIAGNOSIS — C61 Malignant neoplasm of prostate: Secondary | ICD-10-CM | POA: Diagnosis not present

## 2021-07-17 LAB — RAD ONC ARIA SESSION SUMMARY
Course Elapsed Days: 36
Plan Fractions Treated to Date: 26
Plan Prescribed Dose Per Fraction: 2.5 Gy
Plan Total Fractions Prescribed: 28
Plan Total Prescribed Dose: 70 Gy
Reference Point Dosage Given to Date: 65 Gy
Reference Point Session Dosage Given: 2.5 Gy
Session Number: 26

## 2021-07-18 ENCOUNTER — Ambulatory Visit
Admission: RE | Admit: 2021-07-18 | Discharge: 2021-07-18 | Disposition: A | Discharge status: Home / Self Care | Payer: 59 | Source: Ambulatory Visit | Attending: Radiation Oncology | Admitting: Radiation Oncology

## 2021-07-18 ENCOUNTER — Other Ambulatory Visit: Payer: Self-pay

## 2021-07-18 DIAGNOSIS — C61 Malignant neoplasm of prostate: Secondary | ICD-10-CM | POA: Diagnosis not present

## 2021-07-18 LAB — RAD ONC ARIA SESSION SUMMARY
Course Elapsed Days: 37
Plan Fractions Treated to Date: 27
Plan Prescribed Dose Per Fraction: 2.5 Gy
Plan Total Fractions Prescribed: 28
Plan Total Prescribed Dose: 70 Gy
Reference Point Dosage Given to Date: 67.5 Gy
Reference Point Session Dosage Given: 2.5 Gy
Session Number: 27

## 2021-07-21 ENCOUNTER — Other Ambulatory Visit: Payer: Self-pay

## 2021-07-21 ENCOUNTER — Encounter: Payer: Self-pay | Admitting: Radiation Oncology

## 2021-07-21 ENCOUNTER — Ambulatory Visit
Admission: RE | Admit: 2021-07-21 | Discharge: 2021-07-21 | Disposition: A | Discharge status: Home / Self Care | Payer: 59 | Source: Ambulatory Visit | Attending: Radiation Oncology | Admitting: Radiation Oncology

## 2021-07-21 DIAGNOSIS — C61 Malignant neoplasm of prostate: Secondary | ICD-10-CM | POA: Diagnosis not present

## 2021-07-21 LAB — RAD ONC ARIA SESSION SUMMARY
Course Elapsed Days: 40
Plan Fractions Treated to Date: 28
Plan Prescribed Dose Per Fraction: 2.5 Gy
Plan Total Fractions Prescribed: 28
Plan Total Prescribed Dose: 70 Gy
Reference Point Dosage Given to Date: 70 Gy
Reference Point Session Dosage Given: 2.5 Gy
Session Number: 28

## 2021-08-25 NOTE — Progress Notes (Signed)
  Radiation Oncology         [phone] ________________________________  Name: Francisco Sanders MRN: [id number]  Date: 07/21/2021  DOB: [birth year]  End of Treatment Note  Diagnosis:   66 y.o. gentleman with Stage T1c adenocarcinoma of the prostate with Gleason score of 4+3, and PSA of 5.52.     Indication for treatment:  Curative, Definitive Radiotherapy       Radiation treatment dates:   6/7-7/17/23  Site/dose:   The prostate was treated to 70 Gy in 28 fractions of 2.5 Gy  Beams/energy:   The patient was treated with IMRT using volumetric arc therapy delivering 6 MV X-rays to clockwise and counterclockwise circumferential arcs with a 90 degree collimator offset to avoid dose scalloping.  Image guidance was performed with daily cone beam CT prior to each fraction to align to gold markers in the prostate and assure proper bladder and rectal fill volumes.  Immobilization was achieved with BodyFix custom mold.  Narrative: The patient tolerated radiation treatment relatively well.   The patient experienced some minor urinary irritation and modest fatigue.    Plan: The patient has completed radiation treatment. He will return to radiation oncology clinic for routine followup in one month. I advised him to call or return sooner if he has any questions or concerns related to his recovery or treatment. ________________________________  Sheral Apley. Tammi Klippel, M.D.

## 2021-09-02 ENCOUNTER — Encounter: Payer: Self-pay | Admitting: Urology

## 2021-09-02 ENCOUNTER — Telehealth: Payer: Self-pay

## 2021-09-02 NOTE — Progress Notes (Signed)
Telephone appointment. I spoke w/ patient's spouse Mrs. Francisco Sanders, verified her identity and began nursing interview. No issues reported at this time.  Meaningful use complete. I-PSS score of 1-mild. No urinary management medications. Urology appt- None  Reminded patient's spouse of his 9:30am-09/03/21 telephone appointment w/ Ashlyn Bruning PA-C. I left my extension [phone] in case patient needs anything. Patient verbalized understanding.  Patient contact [phone]- Spouse Mrs. Francisco Sanders

## 2021-09-02 NOTE — Telephone Encounter (Signed)
Voicemail reminder for patient's 9:30am-09/03/21 telephone appointment w/ Ashlyn Bruning PA-C. I left my extension [phone] and requested that patient return my call for completion of the nursing portion.

## 2021-09-03 ENCOUNTER — Ambulatory Visit
Admission: RE | Admit: 2021-09-03 | Discharge: 2021-09-03 | Disposition: A | Discharge status: Home / Self Care | Payer: 59 | Source: Ambulatory Visit | Attending: Urology | Admitting: Urology

## 2021-09-03 DIAGNOSIS — C61 Malignant neoplasm of prostate: Secondary | ICD-10-CM | POA: Insufficient documentation

## 2021-09-03 NOTE — Progress Notes (Signed)
Radiation Oncology         [phone] ________________________________  Name: Francisco Sanders MRN: [id number]  Date: 09/03/2021  DOB: [birth year]  Post Treatment Note  CC: [facility]  Vira Agar, MD  Diagnosis:   66 y.o. gentleman with Stage T1c adenocarcinoma of the prostate with Gleason score of 4+3, and PSA of 5.52.     Interval Since Last Radiation:  6 weeks  6/7-7/17/23:  The prostate was treated to 70 Gy in 28 fractions of 2.5 Gy  Narrative:  I spoke with the patient to conduct his routine scheduled 1 month follow up visit via telephone to spare the patient unnecessary potential exposure in the healthcare setting during the current COVID-19 pandemic.  The patient was notified in advance and gave permission to proceed with this visit format.  He  tolerated radiation treatment relatively well with only minor urinary irritation and modest fatigue.  He did experience some intermittent dysuria and nocturia 2-4 times per night but specifically denied gross hematuria, excessive daytime frequency, urgency, straining to void, incomplete bladder emptying or incontinence.  He denied any abdominal pain or bowel issues.                               On review of systems, the patient states that he is doing well in general.  He reports that his LUTS have completely resolved at this point and he is back to his baseline.  He reports a healthy appetite and is maintaining his weight.  He denies any abdominal pain, nausea, vomiting, diarrhea or constipation.  His energy level is gradually improving and overall, he is pleased with his progress to date.  ALLERGIES:  has No Known Allergies.  Meds: Current Outpatient Medications  Medication Sig Dispense Refill   acetaminophen (TYLENOL) 500 MG tablet Take 500 mg by mouth every 6 (six) hours as needed.     aspirin 81 MG chewable tablet Chew 81 mg by mouth daily.     atorvastatin (LIPITOR) 20 MG tablet Take 20 mg by mouth daily.      bimatoprost (LUMIGAN) 0.03 % ophthalmic solution Place 1 drop into both eyes at bedtime. Place one drop into affected eye daily, every evening.     brimonidine-timolol (COMBIGAN) 0.2-0.5 % ophthalmic solution Place 1 drop into both eyes.     diltiazem (CARDIZEM CD) 360 MG 24 hr capsule Take 360 mg by mouth daily.     dorzolamide-timolol (COSOPT) 22.3-6.8 MG/ML ophthalmic solution Place 1 drop into both eyes 2 (two) times daily.     etodolac (LODINE) 400 MG tablet Take 400 mg by mouth 2 (two) times daily as needed.     fluorometholone (FML) 0.1 % ophthalmic suspension Place 1 drop into the right eye daily.     metFORMIN (GLUCOPHAGE) 500 MG tablet Take by mouth 2 (two) times daily with a meal. (Patient not taking: Reported on 05/16/2021)     sildenafil (REVATIO) 20 MG tablet Take 20 mg by mouth as needed.     telmisartan-hydrochlorothiazide (MICARDIS HCT) 80-25 MG per tablet Take 1 tablet by mouth daily.     No current facility-administered medications for this encounter.    Physical Findings:  vitals were not taken for this visit.  Pain Assessment Pain Score: 0-No pain/10 Unable to assess due to telephone follow-up visit format.  Lab Findings: Lab Results  Component Value Date   HGB 15.0 05/20/2021  HCT 44.0 05/20/2021     Radiographic Findings: No results found.  Impression/Plan: 1. 66 y.o. gentleman with Stage T1c adenocarcinoma of the prostate with Gleason score of 4+3, and PSA of 5.52.    He will continue to follow up with urology for ongoing PSA determinations but does not currently have any follow-up appointment scheduled with Dr. Cain Sieve to his knowledge.  I advised that I will share this information with Dr.  Cain Sieve and that he will likely hear from one of his staff in the next week to get him scheduled for a follow-up visit sometime in October 2023, approximately 3 months out from completing his radiation treatments.  He understands what to expect with regards to PSA  monitoring going forward. I will look forward to following his response to treatment via correspondence with urology, and would be happy to continue to participate in his care if clinically indicated. I talked to the patient about what to expect in the future, including his risk for erectile dysfunction and rectal bleeding. I encouraged him to call or return to the office if he has any questions regarding his previous radiation or possible radiation side effects. He was comfortable with this plan and will follow up as needed.     Nicholos Johns, PA-C

## 2021-09-22 NOTE — Progress Notes (Signed)
Patient recently completed his radiation treatment for his stage T1c adenocarcinoma of the prostate with Gleason score of 4+3, and PSA of 5.52.     Patient will be approximately 3 months post radiation in October, 2023.   RN contacted [facility] and scheduled patient for follow up with his urologist on 10/12 @ 1pm.  Patient aware, no further needs at this time.

## 2021-11-04 DIAGNOSIS — H401132 Primary open-angle glaucoma, bilateral, moderate stage: Secondary | ICD-10-CM | POA: Diagnosis not present

## 2021-12-22 DIAGNOSIS — H401131 Primary open-angle glaucoma, bilateral, mild stage: Secondary | ICD-10-CM | POA: Diagnosis not present

## 2022-01-26 DIAGNOSIS — E1169 Type 2 diabetes mellitus with other specified complication: Secondary | ICD-10-CM | POA: Diagnosis not present

## 2022-01-26 DIAGNOSIS — E7800 Pure hypercholesterolemia, unspecified: Secondary | ICD-10-CM | POA: Diagnosis not present

## 2022-01-26 DIAGNOSIS — Z0000 Encounter for general adult medical examination without abnormal findings: Secondary | ICD-10-CM | POA: Diagnosis not present

## 2022-01-26 DIAGNOSIS — I10 Essential (primary) hypertension: Secondary | ICD-10-CM | POA: Diagnosis not present

## 2022-01-26 DIAGNOSIS — Z23 Encounter for immunization: Secondary | ICD-10-CM | POA: Diagnosis not present

## 2022-02-06 DIAGNOSIS — H401132 Primary open-angle glaucoma, bilateral, moderate stage: Secondary | ICD-10-CM | POA: Diagnosis not present

## 2022-05-15 DIAGNOSIS — H401131 Primary open-angle glaucoma, bilateral, mild stage: Secondary | ICD-10-CM | POA: Diagnosis not present

## 2022-05-29 DIAGNOSIS — R300 Dysuria: Secondary | ICD-10-CM | POA: Diagnosis not present

## 2022-07-27 DIAGNOSIS — L309 Dermatitis, unspecified: Secondary | ICD-10-CM | POA: Diagnosis not present

## 2022-07-27 DIAGNOSIS — I77810 Thoracic aortic ectasia: Secondary | ICD-10-CM | POA: Diagnosis not present

## 2022-07-27 DIAGNOSIS — E1169 Type 2 diabetes mellitus with other specified complication: Secondary | ICD-10-CM | POA: Diagnosis not present

## 2022-07-27 DIAGNOSIS — E7800 Pure hypercholesterolemia, unspecified: Secondary | ICD-10-CM | POA: Diagnosis not present

## 2022-07-27 DIAGNOSIS — E119 Type 2 diabetes mellitus without complications: Secondary | ICD-10-CM | POA: Diagnosis not present

## 2022-07-27 DIAGNOSIS — I10 Essential (primary) hypertension: Secondary | ICD-10-CM | POA: Diagnosis not present

## 2022-09-04 DIAGNOSIS — H401131 Primary open-angle glaucoma, bilateral, mild stage: Secondary | ICD-10-CM | POA: Diagnosis not present

## 2022-09-04 DIAGNOSIS — H524 Presbyopia: Secondary | ICD-10-CM | POA: Diagnosis not present

## 2022-09-04 DIAGNOSIS — H2513 Age-related nuclear cataract, bilateral: Secondary | ICD-10-CM | POA: Diagnosis not present

## 2022-11-23 DIAGNOSIS — R350 Frequency of micturition: Secondary | ICD-10-CM | POA: Diagnosis not present

## 2022-12-18 DIAGNOSIS — H401131 Primary open-angle glaucoma, bilateral, mild stage: Secondary | ICD-10-CM | POA: Diagnosis not present

## 2022-12-18 DIAGNOSIS — H2513 Age-related nuclear cataract, bilateral: Secondary | ICD-10-CM | POA: Diagnosis not present

## 2023-09-28 ENCOUNTER — Other Ambulatory Visit (HOSPITAL_COMMUNITY): Payer: Self-pay | Admitting: Family Medicine

## 2023-09-28 DIAGNOSIS — I77810 Thoracic aortic ectasia: Secondary | ICD-10-CM

## 2023-10-04 ENCOUNTER — Ambulatory Visit (HOSPITAL_COMMUNITY)
Admission: RE | Admit: 2023-10-04 | Discharge: 2023-10-04 | Disposition: A | Source: Ambulatory Visit | Attending: Family Medicine | Admitting: Family Medicine

## 2023-10-04 DIAGNOSIS — E119 Type 2 diabetes mellitus without complications: Secondary | ICD-10-CM | POA: Insufficient documentation

## 2023-10-04 DIAGNOSIS — I517 Cardiomegaly: Secondary | ICD-10-CM | POA: Diagnosis not present

## 2023-10-04 DIAGNOSIS — I77810 Thoracic aortic ectasia: Secondary | ICD-10-CM | POA: Insufficient documentation

## 2023-10-04 DIAGNOSIS — I10 Essential (primary) hypertension: Secondary | ICD-10-CM | POA: Diagnosis not present

## 2023-10-04 DIAGNOSIS — E785 Hyperlipidemia, unspecified: Secondary | ICD-10-CM | POA: Diagnosis not present

## 2023-10-04 LAB — ECHOCARDIOGRAM COMPLETE
Area-P 1/2: 5.42 cm2
Calc EF: 72.3 %
S' Lateral: 3.3 cm
Single Plane A2C EF: 69.3 %
Single Plane A4C EF: 74.3 %

## 2023-10-04 NOTE — Progress Notes (Signed)
  Echocardiogram 2D Echocardiogram has been performed.  Norleen ORN [facility] 10/04/2023, 8:24 AM

## 2023-12-01 IMAGING — PT NM PET TUM IMG SKULL BASE T - THIGH
1 series · 5 of 5 positions shown · non-contrast
Comparison: None.

CLINICAL DATA: PSA of 5.5. Prostate cancer diagnosed in [REDACTED].
No surgery.

EXAM:
NUCLEAR MEDICINE PET SKULL BASE TO THIGH
TECHNIQUE: 9.7 mCi F18 Piflufolastat (Pylarify) was injected intravenously.
Full-ring PET imaging was performed from the skull base to thigh
after the radiotracer. CT data was obtained and used for attenuation
correction and anatomic localization.

[Series 1054: results mm oncology reading · 3.0mm · 0.94mm/px · 5 of 5 slices shown]
[im 1/5]
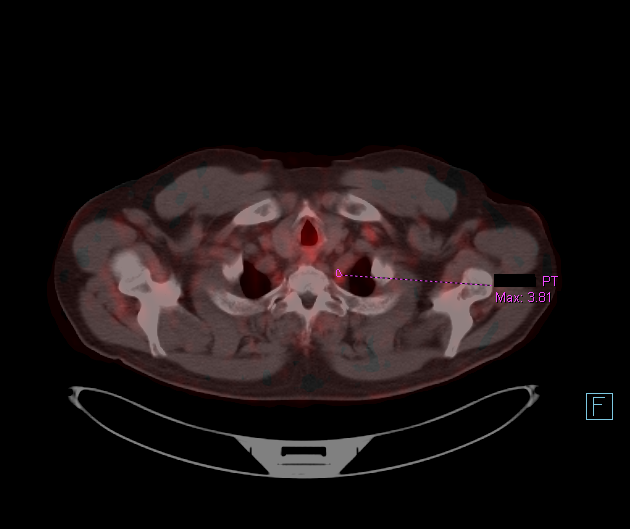
[im 2/5]
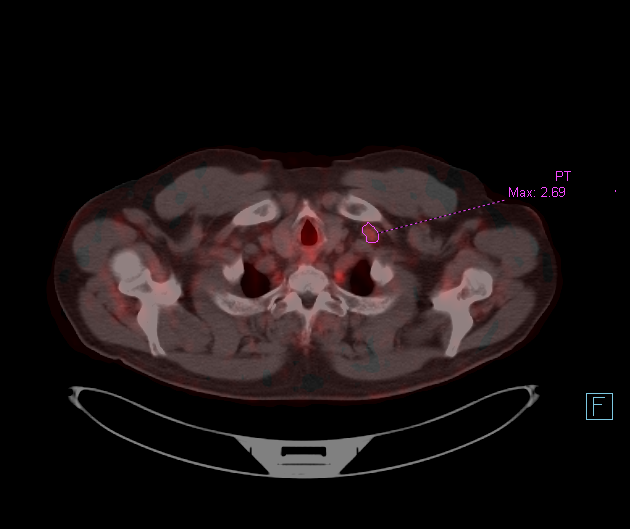
[im 3/5]
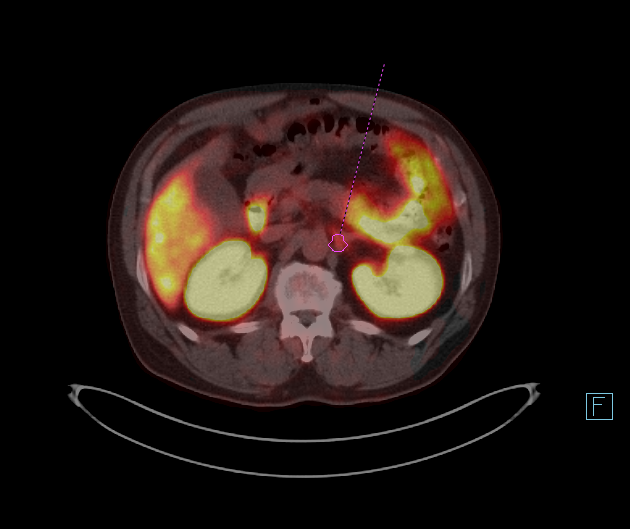
[im 4/5]
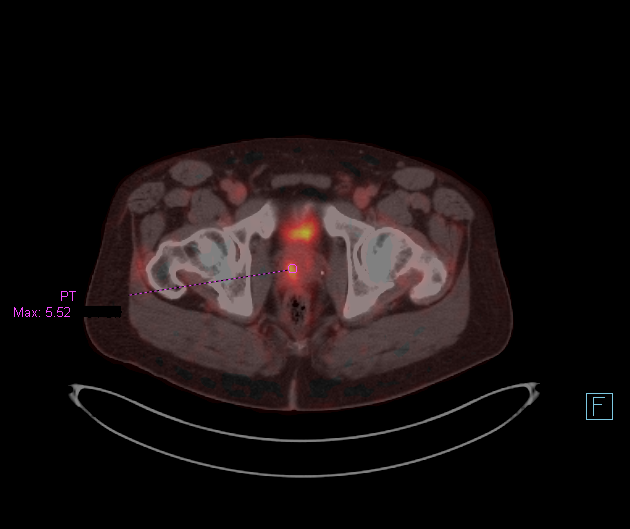
[im 5/5]
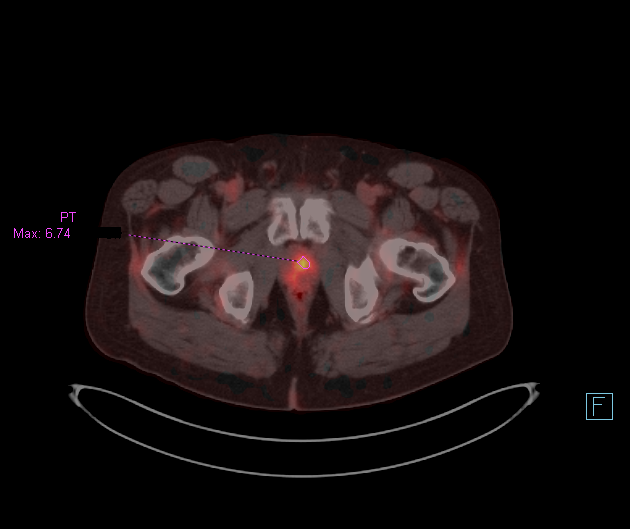

[5 of 5 positions shown; findings below may reference images not displayed]

FINDINGS: NECK

Symmetric tiny soft tissue densities with low-level hypermetabolism
at the thoracic inlet, including on the left at a S.U.V. max of
on 66/4 are most consistent with normal variation activity within
cervical ganglia. No cervical nodal hypermetabolism.

Incidental CT finding: Bilateral carotid atherosclerosis. No
cervical adenopathy.

CHEST

A left supraclavicular node measures 8 mm and a S.U.V. max of 2.7 on
75/4. No pulmonary parenchymal tracer uptake.

Incidental CT finding: Mild cardiomegaly.  Aortic atherosclerosis.

ABDOMEN/PELVIS

Prostate: Multifocal prostatic uptake. Example within the central
right base at a S.U.V. max of 5.5. Within the left apical anterior
and less so posterior gland at a S.U.V. max of 6.7.

Lymph nodes: No abnormal radiotracer accumulation within pelvic or
abdominal nodes. Uptake within a 7 mm soft tissue density along the
celiac axis on 143/4 at a S.U.V. max of 3.3 is typical of normal
variant celiac ganglion uptake.

Liver: No evidence of liver metastasis

Incidental CT finding: No abdominopelvic adenopathy. Normal adrenal
glands. Normal noncontrast appearance of the liver, pancreas,
gallbladder. Extensive colonic diverticulosis.

SKELETON

No focal  activity to suggest skeletal metastasis.

Degenerative sclerosis of both sacroiliac joints. No suspicious
osseous lesion.
IMPRESSION: 1. Uptake within the prostate is most consistent with primary lesion
or lesions.
2. A left supraclavicular subcentimeter node demonstrates low-level
tracer uptake, favored to be reactive. Not in an initial nodal
drainage pattern for prostate.
3. Normal variant uptake within celiac and cervical ganglia, as
detailed above.
4. Incidental findings, including: Aortic Atherosclerosis
([id number]).
